# Patient Record
Sex: Female | Born: 1990 | Race: Black or African American | Hispanic: No | Marital: Single | Smoking: Current some day smoker
Health system: Southern US, Community
[De-identification: ages and names within clinical notes are randomized; demographics above are authoritative.]

## PROBLEM LIST (undated history)

## (undated) ENCOUNTER — Inpatient Hospital Stay (HOSPITAL_COMMUNITY): Payer: Self-pay

## (undated) DIAGNOSIS — A749 Chlamydial infection, unspecified: Secondary | ICD-10-CM

## (undated) DIAGNOSIS — N390 Urinary tract infection, site not specified: Secondary | ICD-10-CM

## (undated) DIAGNOSIS — B379 Candidiasis, unspecified: Secondary | ICD-10-CM

## (undated) DIAGNOSIS — D219 Benign neoplasm of connective and other soft tissue, unspecified: Secondary | ICD-10-CM

## (undated) DIAGNOSIS — Z789 Other specified health status: Secondary | ICD-10-CM

## (undated) HISTORY — PX: EYE SURGERY: SHX253

## (undated) HISTORY — PX: NO PAST SURGERIES: SHX2092

---
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
## 2008-02-01 ENCOUNTER — Emergency Department (HOSPITAL_COMMUNITY): Admission: EM | Admit: 2008-02-01 | Discharge: 2008-02-01 | Payer: Self-pay | Admitting: Emergency Medicine

## 2008-02-21 ENCOUNTER — Emergency Department (HOSPITAL_COMMUNITY): Admission: EM | Admit: 2008-02-21 | Discharge: 2008-02-21 | Payer: Self-pay | Admitting: Emergency Medicine

## 2008-08-03 ENCOUNTER — Emergency Department (HOSPITAL_COMMUNITY): Admission: EM | Admit: 2008-08-03 | Discharge: 2008-08-03 | Payer: Self-pay | Admitting: Emergency Medicine

## 2008-10-25 ENCOUNTER — Emergency Department (HOSPITAL_COMMUNITY): Admission: EM | Admit: 2008-10-25 | Discharge: 2008-10-25 | Payer: Self-pay | Admitting: Emergency Medicine

## 2009-02-05 ENCOUNTER — Emergency Department (HOSPITAL_COMMUNITY): Admission: EM | Admit: 2009-02-05 | Discharge: 2009-02-05 | Payer: Self-pay | Admitting: Family Medicine

## 2009-05-29 ENCOUNTER — Emergency Department (HOSPITAL_COMMUNITY): Admission: EM | Admit: 2009-05-29 | Discharge: 2009-05-29 | Payer: Self-pay | Admitting: Emergency Medicine

## 2009-12-31 ENCOUNTER — Emergency Department (HOSPITAL_COMMUNITY): Admission: EM | Admit: 2009-12-31 | Discharge: 2009-12-31 | Payer: Self-pay | Admitting: Emergency Medicine

## 2010-01-14 ENCOUNTER — Emergency Department (HOSPITAL_COMMUNITY): Admission: EM | Admit: 2010-01-14 | Discharge: 2010-01-15 | Payer: Self-pay | Admitting: Emergency Medicine

## 2010-08-06 ENCOUNTER — Emergency Department (HOSPITAL_COMMUNITY)
Admission: EM | Admit: 2010-08-06 | Discharge: 2010-08-06 | Disposition: A | Discharge status: Home / Self Care | Payer: Self-pay | Attending: Emergency Medicine | Admitting: Emergency Medicine

## 2010-08-06 DIAGNOSIS — H1130 Conjunctival hemorrhage, unspecified eye: Secondary | ICD-10-CM | POA: Insufficient documentation

## 2010-08-06 DIAGNOSIS — S01119A Laceration without foreign body of unspecified eyelid and periocular area, initial encounter: Secondary | ICD-10-CM | POA: Insufficient documentation

## 2010-08-06 DIAGNOSIS — T148XXA Other injury of unspecified body region, initial encounter: Secondary | ICD-10-CM | POA: Insufficient documentation

## 2010-08-06 LAB — URINALYSIS, ROUTINE W REFLEX MICROSCOPIC
Bilirubin Urine: NEGATIVE
Hgb urine dipstick: NEGATIVE
Ketones, ur: NEGATIVE mg/dL
Protein, ur: 100 mg/dL — AB
Urobilinogen, UA: 1 mg/dL (ref 0.0–1.0)

## 2010-08-06 LAB — URINE MICROSCOPIC-ADD ON
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
## 2010-08-28 LAB — URINALYSIS, ROUTINE W REFLEX MICROSCOPIC
Glucose, UA: NEGATIVE mg/dL
Hgb urine dipstick: NEGATIVE
Specific Gravity, Urine: 1.025 (ref 1.005–1.030)
Urobilinogen, UA: 1 mg/dL (ref 0.0–1.0)
pH: 7 (ref 5.0–8.0)

## 2010-08-28 LAB — URINE CULTURE: Culture: NO GROWTH

## 2010-08-28 LAB — WET PREP, GENITAL: Clue Cells Wet Prep HPF POC: NONE SEEN

## 2010-08-28 LAB — GC/CHLAMYDIA PROBE AMP, GENITAL

## 2010-08-28 LAB — CULTURE, ROUTINE-ABSCESS

## 2010-09-14 LAB — DIFFERENTIAL
Eosinophils Absolute: 0.3 10*3/uL (ref 0.0–0.7)
Eosinophils Relative: 3 % (ref 0–5)
Lymphs Abs: 3.1 10*3/uL (ref 0.7–4.0)
Monocytes Absolute: 0.7 10*3/uL (ref 0.1–1.0)
Monocytes Relative: 9 % (ref 3–12)

## 2010-09-14 LAB — POCT I-STAT, CHEM 8
HCT: 40 % (ref 36.0–46.0)
Hemoglobin: 13.6 g/dL (ref 12.0–15.0)
Potassium: 4 mEq/L (ref 3.5–5.1)
Sodium: 139 mEq/L (ref 135–145)

## 2010-09-14 LAB — CBC
HCT: 36.3 % (ref 36.0–46.0)
MCV: 80.8 fL (ref 78.0–100.0)
RBC: 4.49 MIL/uL (ref 3.87–5.11)
WBC: 7.8 10*3/uL (ref 4.0–10.5)

## 2010-09-14 LAB — URINE MICROSCOPIC-ADD ON

## 2010-09-14 LAB — URINALYSIS, ROUTINE W REFLEX MICROSCOPIC
Bilirubin Urine: NEGATIVE
Nitrite: NEGATIVE
Protein, ur: NEGATIVE mg/dL
Specific Gravity, Urine: 1.026 (ref 1.005–1.030)
Urobilinogen, UA: 1 mg/dL (ref 0.0–1.0)

## 2010-09-14 LAB — GC/CHLAMYDIA PROBE AMP, GENITAL

## 2010-09-19 LAB — CULTURE, ROUTINE-ABSCESS

## 2010-09-29 LAB — URINE MICROSCOPIC-ADD ON

## 2010-09-29 LAB — WET PREP, GENITAL: Yeast Wet Prep HPF POC: NONE SEEN

## 2010-09-29 LAB — URINALYSIS, ROUTINE W REFLEX MICROSCOPIC
Glucose, UA: NEGATIVE mg/dL
Ketones, ur: 15 mg/dL — AB
Nitrite: NEGATIVE
Specific Gravity, Urine: 1.037 — ABNORMAL HIGH (ref 1.005–1.030)
pH: 6 (ref 5.0–8.0)

## 2010-10-14 ENCOUNTER — Emergency Department (HOSPITAL_COMMUNITY)
Admission: EM | Admit: 2010-10-14 | Discharge: 2010-10-14 | Disposition: A | Discharge status: Home / Self Care | Payer: Self-pay | Attending: Emergency Medicine | Admitting: Emergency Medicine

## 2010-10-14 DIAGNOSIS — N390 Urinary tract infection, site not specified: Secondary | ICD-10-CM | POA: Insufficient documentation

## 2010-10-14 DIAGNOSIS — A499 Bacterial infection, unspecified: Secondary | ICD-10-CM | POA: Insufficient documentation

## 2010-10-14 DIAGNOSIS — B9689 Other specified bacterial agents as the cause of diseases classified elsewhere: Secondary | ICD-10-CM | POA: Insufficient documentation

## 2010-10-14 DIAGNOSIS — N760 Acute vaginitis: Secondary | ICD-10-CM | POA: Insufficient documentation

## 2010-10-14 LAB — WET PREP, GENITAL
Trich, Wet Prep: NONE SEEN
Yeast Wet Prep HPF POC: NONE SEEN

## 2010-10-14 LAB — URINALYSIS, ROUTINE W REFLEX MICROSCOPIC
Bilirubin Urine: NEGATIVE
Glucose, UA: NEGATIVE mg/dL
Hgb urine dipstick: NEGATIVE
Ketones, ur: NEGATIVE mg/dL
Protein, ur: NEGATIVE mg/dL
Urobilinogen, UA: 1 mg/dL (ref 0.0–1.0)

## 2010-10-27 NOTE — Op Note (Signed)
NAME:  Kayla Baird, Kayla Baird               ACCOUNT NO.:  [id number]   MEDICAL RECORD NO.:  [id number]          PATIENT TYPE:  INP   LOCATION:  9101                          FACILITY:  [facility]   PHYSICIAN:  Phil D. Okey Dupre, M.D.     DATE OF BIRTH:  10-31-1990   DATE OF PROCEDURE:  08/15/2007  DATE OF DISCHARGE:                               OPERATIVE REPORT   PREOPERATIVE DIAGNOSIS:  1. Intrauterine pregnancy at 40 weeks, 5 days gestation.  2. Fetal tachycardia with variable decelerations.  3. Meconium-stained amniotomy fluid.  4. Group B Streptococcus positive.  5. Intrapartum fever.  6. No prenatal care.   POSTOPERATIVE DIAGNOSIS:  1. Intrauterine pregnancy at 40 weeks, 5 days gestation.  2. Fetal tachycardia with variable decelerations.  3. Meconium-stained amniotomy fluid.  4. Group B Streptococcus positive.  5. Intrapartum fever.  6. No prenatal care.   PROCEDURE:  Vacuum-assisted vaginal delivery with Kiwi vacuum.   SURGEON:  Karlton Lemon, MD   ATTENDING:  Javier Glazier. Okey Dupre, M.D.   ANESTHESIA:  Epidural.   FINDINGS:  A viable infant female weighing 6 pounds, 7 ounces with Apgars  5 at 1 minute, 8 at 5 minutes with core pH of 7.14.   SPECIMENS:  None.   ESTIMATED BLOOD LOSS:  500 ml.   COMPLICATIONS:  None immediate.   INDICATIONS FOR PROCEDURE:  Kayla Baird is a 20 year old gravida 1, para  0, at 40 weeks, 5 days that presented with rupture of membranes.  Her  prenatal course was complicated by no prenatal care.  During her labor,  she was found to have meconium-stained amniotic fluid.  She developed  fever approximately one to two hours before delivery requiring  ampicillin and gentamicin, and the baby started having fetal tachycardia  40 minutes prior to delivery after onset of maternal fever.   INDICATION FOR PROCEDURE:  Fetal tachycardia.   DESCRIPTION OF PROCEDURE:  Kayla Baird was in the lithotomy position and  pushing.  The infant's head was +3 station and with pushing there  was  minimal descent of the head past +3 station.  Kiwi vacuum was applied to  the fetal scalp being careful not to place it over anterior fontanelle.  The placement of the vacuum was confirmed, and no vaginal tissue was  felt to be within the vacuum.  With the patient's next contraction, the  Kiwi vacuum was pumped up into the green range, and while the patient  was pushing, traction was applied to the Kiwi vacuum.  With one long  steady pull, the infant's head was delivered in occiput posterior  position.  Vacuum was removed from the infant's head.  There was a  nuchal cord x1 noted and was reduced on the perineum.  The infant was  then delivered atraumatically.  Infant was bulb suctioned after delivery  and gave a weak cry.  Cord was clamped x2 and incised between the  clamps.  The infant was handed to the neonatology team in attendance.  Cord blood and pH were then collected.  Placenta was delivered  spontaneously intact with three-vessel cord and heavy  meconium staining  of the membranes.  Vagina and perineum were inspected with a second-  degree perineal laceration noted.  This was repaired in the usual  manner.  Figure-of-eight sutures were needed after initial repair to  provide good hemostasis.  Estimated blood loss for the procedure was 500  ml.  The patient did receive Methergine 0.2 mg for brisk bleeding as  well as bimanual uterine massage.  The patient tolerated the procedure  well and will go to mother-baby unit in stable condition.      Karlton Lemon, MD  Electronically Signed     ______________________________  Javier Glazier. Okey Dupre, M.D.    NS/MEDQ  D:  08/16/2007  T:  08/16/2007  Job:  [id number]

## 2011-02-08 ENCOUNTER — Emergency Department (HOSPITAL_COMMUNITY)
Admission: EM | Admit: 2011-02-08 | Discharge: 2011-02-08 | Disposition: A | Discharge status: Home / Self Care | Payer: Medicaid Other | Attending: Emergency Medicine | Admitting: Emergency Medicine

## 2011-02-08 ENCOUNTER — Inpatient Hospital Stay (INDEPENDENT_AMBULATORY_CARE_PROVIDER_SITE_OTHER)
Admission: RE | Admit: 2011-02-08 | Discharge: 2011-02-08 | Disposition: A | Discharge status: Home / Self Care | Payer: Self-pay | Source: Ambulatory Visit | Attending: Family Medicine | Admitting: Family Medicine

## 2011-02-08 ENCOUNTER — Emergency Department (HOSPITAL_COMMUNITY): Payer: Medicaid Other

## 2011-02-08 DIAGNOSIS — M25519 Pain in unspecified shoulder: Secondary | ICD-10-CM | POA: Insufficient documentation

## 2011-02-08 DIAGNOSIS — M545 Low back pain, unspecified: Secondary | ICD-10-CM | POA: Insufficient documentation

## 2011-02-08 DIAGNOSIS — S0990XA Unspecified injury of head, initial encounter: Secondary | ICD-10-CM

## 2011-02-08 DIAGNOSIS — R109 Unspecified abdominal pain: Secondary | ICD-10-CM | POA: Insufficient documentation

## 2011-02-08 DIAGNOSIS — Z733 Stress, not elsewhere classified: Secondary | ICD-10-CM

## 2011-02-08 DIAGNOSIS — M542 Cervicalgia: Secondary | ICD-10-CM

## 2011-02-08 DIAGNOSIS — R51 Headache: Secondary | ICD-10-CM | POA: Insufficient documentation

## 2011-02-08 DIAGNOSIS — S139XXA Sprain of joints and ligaments of unspecified parts of neck, initial encounter: Secondary | ICD-10-CM | POA: Insufficient documentation

## 2011-02-08 DIAGNOSIS — Y9229 Other specified public building as the place of occurrence of the external cause: Secondary | ICD-10-CM | POA: Insufficient documentation

## 2011-02-08 DIAGNOSIS — H5789 Other specified disorders of eye and adnexa: Secondary | ICD-10-CM | POA: Insufficient documentation

## 2011-02-08 LAB — POCT PREGNANCY, URINE: Preg Test, Ur: NEGATIVE

## 2011-02-08 LAB — URINALYSIS, ROUTINE W REFLEX MICROSCOPIC
Bilirubin Urine: NEGATIVE
Glucose, UA: NEGATIVE mg/dL
Ketones, ur: 15 mg/dL — AB
Leukocytes, UA: NEGATIVE
Nitrite: NEGATIVE
Protein, ur: NEGATIVE mg/dL

## 2011-03-05 LAB — WET PREP, GENITAL
Clue Cells Wet Prep HPF POC: NONE SEEN
Trich, Wet Prep: NONE SEEN
Yeast Wet Prep HPF POC: NONE SEEN

## 2011-03-05 LAB — DIFFERENTIAL
Basophils Absolute: 0
Eosinophils Relative: 3
Lymphocytes Relative: 30
Lymphs Abs: 1.6
Neutro Abs: 3.3
Neutrophils Relative %: 60

## 2011-03-05 LAB — TYPE AND SCREEN
ABO/RH(D): O POS
Antibody Screen: NEGATIVE

## 2011-03-05 LAB — URINALYSIS, ROUTINE W REFLEX MICROSCOPIC
Bilirubin Urine: NEGATIVE
Ketones, ur: NEGATIVE
Nitrite: NEGATIVE
Specific Gravity, Urine: 1.015
pH: 6.5

## 2011-03-05 LAB — RUBELLA SCREEN: Rubella: 40.3 — ABNORMAL HIGH

## 2011-03-05 LAB — URINE CULTURE: Colony Count: >=100000

## 2011-03-05 LAB — GC/CHLAMYDIA PROBE AMP, GENITAL
Chlamydia, DNA Probe: NEGATIVE
Chlamydia, DNA Probe: NEGATIVE
GC Probe Amp, Genital: NEGATIVE
GC Probe Amp, Genital: NEGATIVE

## 2011-03-05 LAB — RAPID URINE DRUG SCREEN, HOSP PERFORMED
Barbiturates: NOT DETECTED
Benzodiazepines: NOT DETECTED
Cocaine: NOT DETECTED
Opiates: NOT DETECTED

## 2011-03-05 LAB — CBC
HCT: 31.2 — ABNORMAL LOW
Platelets: 255
RDW: 14.2
WBC: 5.4

## 2011-03-05 LAB — URINE MICROSCOPIC-ADD ON

## 2011-03-05 LAB — HEPATITIS B SURFACE ANTIGEN: Hepatitis B Surface Ag: NEGATIVE

## 2011-03-05 LAB — STREP B DNA PROBE

## 2011-03-05 LAB — HIV ANTIBODY (ROUTINE TESTING W REFLEX): HIV: NONREACTIVE

## 2011-03-08 LAB — CBC
HCT: 28.6 — ABNORMAL LOW
Hemoglobin: 9.7 — ABNORMAL LOW
MCHC: 33.8
RDW: 14.6

## 2011-03-08 LAB — COMPREHENSIVE METABOLIC PANEL
Alkaline Phosphatase: 165 — ABNORMAL HIGH
BUN: 4 — ABNORMAL LOW
Calcium: 8.7
Glucose, Bld: 83
Potassium: 3.9
Total Protein: 5.5 — ABNORMAL LOW

## 2011-03-08 LAB — URINALYSIS, ROUTINE W REFLEX MICROSCOPIC
Nitrite: NEGATIVE
Specific Gravity, Urine: 1.01
Urobilinogen, UA: 0.2

## 2011-03-08 LAB — RPR: RPR Ser Ql: NONREACTIVE

## 2011-03-08 LAB — URIC ACID: Uric Acid, Serum: 3.4

## 2011-03-08 LAB — RAPID URINE DRUG SCREEN, HOSP PERFORMED
Barbiturates: NOT DETECTED
Opiates: NOT DETECTED

## 2011-03-17 LAB — URINALYSIS, ROUTINE W REFLEX MICROSCOPIC
Hgb urine dipstick: NEGATIVE
Nitrite: NEGATIVE
Specific Gravity, Urine: 1.016
Urobilinogen, UA: 1

## 2011-03-17 LAB — GC/CHLAMYDIA PROBE AMP, GENITAL: Chlamydia, DNA Probe: NEGATIVE

## 2011-03-17 LAB — URINE MICROSCOPIC-ADD ON

## 2011-03-17 LAB — WET PREP, GENITAL: Trich, Wet Prep: NONE SEEN

## 2011-03-17 LAB — PREGNANCY, URINE: Preg Test, Ur: NEGATIVE

## 2011-03-29 LAB — URINE MICROSCOPIC-ADD ON

## 2011-03-29 LAB — URINALYSIS, ROUTINE W REFLEX MICROSCOPIC
Bilirubin Urine: NEGATIVE
Glucose, UA: NEGATIVE
Hgb urine dipstick: NEGATIVE
pH: 6.5

## 2011-03-29 LAB — POCT PREGNANCY, URINE
Operator id: 29026
Preg Test, Ur: POSITIVE

## 2011-03-29 LAB — GC/CHLAMYDIA PROBE AMP, GENITAL
Chlamydia, DNA Probe: NEGATIVE
GC Probe Amp, Genital: POSITIVE — AB

## 2011-06-19 ENCOUNTER — Emergency Department (HOSPITAL_COMMUNITY)
Admission: EM | Admit: 2011-06-19 | Discharge: 2011-06-19 | Disposition: A | Discharge status: Home / Self Care | Payer: Medicaid Other | Attending: Emergency Medicine | Admitting: Emergency Medicine

## 2011-06-19 ENCOUNTER — Encounter: Payer: Self-pay | Admitting: Emergency Medicine

## 2011-06-19 DIAGNOSIS — Z043 Encounter for examination and observation following other accident: Secondary | ICD-10-CM | POA: Insufficient documentation

## 2011-06-19 DIAGNOSIS — F17200 Nicotine dependence, unspecified, uncomplicated: Secondary | ICD-10-CM | POA: Insufficient documentation
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
## 2011-10-14 ENCOUNTER — Encounter (HOSPITAL_COMMUNITY): Payer: Self-pay | Admitting: *Deleted

## 2011-10-14 ENCOUNTER — Emergency Department (HOSPITAL_COMMUNITY)
Admission: EM | Admit: 2011-10-14 | Discharge: 2011-10-14 | Disposition: A | Discharge status: Home / Self Care | Payer: Medicaid Other | Attending: Emergency Medicine | Admitting: Emergency Medicine

## 2011-10-14 DIAGNOSIS — M79609 Pain in unspecified limb: Secondary | ICD-10-CM | POA: Insufficient documentation

## 2011-10-14 DIAGNOSIS — F17200 Nicotine dependence, unspecified, uncomplicated: Secondary | ICD-10-CM | POA: Insufficient documentation

## 2011-10-14 DIAGNOSIS — IMO0002 Reserved for concepts with insufficient information to code with codable children: Secondary | ICD-10-CM | POA: Insufficient documentation

## 2011-10-14 DIAGNOSIS — L02411 Cutaneous abscess of right axilla: Secondary | ICD-10-CM

## 2011-10-14 MED ORDER — LIDOCAINE-EPINEPHRINE (PF) 2 %-1:200000 IJ SOLN
10.0000 mL | Freq: Once | INTRAMUSCULAR | Status: AC
  Administered 2011-10-14: 10 mL

## 2011-10-14 MED ORDER — HYDROCODONE-ACETAMINOPHEN 5-325 MG PO TABS
1.0000 | ORAL_TABLET | Freq: Once | ORAL | Status: AC
  Administered 2011-10-14: 1 via ORAL
  Filled 2011-10-14: qty 1

## 2011-10-14 NOTE — ED Notes (Signed)
Pt reports she has an abscess to right underarm. Pt noted area on Monday. Pt reports history of abscesses to this area multiple times before.  Pt denies fever.  Pt denies taking medication for pain.

## 2011-10-14 NOTE — ED Provider Notes (Signed)
History     CSN: [id number]  Arrival date & time 10/14/11  1478   First MD Initiated Contact with Patient 10/14/11 0840      Chief Complaint  Patient presents with  . Abscess    (Consider location/radiation/quality/duration/timing/severity/associated sxs/prior treatment) HPI History from patient. 21 year old female who presents with abscess to the right underarm. 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 She was referred to surgery for this, and they recommended she undergo surgical excision, but she did not follow through with this. She states that this came up on Sunday and has grown larger in size since. There's been no drainage from the area. She denies fever or chills. She has noted no overlying redness to the area.  History reviewed. No pertinent past medical history.  History reviewed. No pertinent past surgical history.  No family history on file.  History  Substance Use Topics  . Smoking status: Current Everyday Smoker  . Smokeless tobacco: Not on file  . Alcohol Use: Yes    OB History    Grav Para Term Preterm Abortions TAB SAB Ect Mult Living                  Review of Systems  Constitutional: Negative for fever and chills.  Musculoskeletal: Negative.   Skin: Positive for wound.  Neurological: Negative.     Allergies  Review of patient's allergies indicates no known allergies.  Home Medications  No current outpatient prescriptions on file.  BP 144/72  Pulse 77  Temp(Src) 97.6 F (36.4 C) (Oral)  Resp 17  SpO2 99%  LMP 09/14/2011  Physical Exam  Nursing note and vitals reviewed. Constitutional: She appears well-developed and well-nourished. No distress.  HENT:  Head: Normocephalic and atraumatic.  Neck: Normal range of motion.  Cardiovascular: Normal rate.   Pulmonary/Chest: Effort normal.  Musculoskeletal: Normal range of motion.  Neurological: She is alert.    Skin: Skin is warm and dry. She is not diaphoretic.       Approximately a 3 cm area of fluctuance to the right axilla. No drainage noted. No overlying cellulitis. Area is tender to palpation. Multiple scars noted from previous incisions and drainage.  Psychiatric: She has a normal mood and affect.    ED Course  Procedures (including critical care time)  INCISION AND DRAINAGE Performed by: Grant Fontana Consent: Verbal consent obtained. Risks and benefits: risks, benefits and alternatives were discussed Type: abscess  Body area: R axilla  Anesthesia: local infiltration  Local anesthetic: lidocaine 2% with epinephrine  Anesthetic total: 1.5 ml  Complexity: complex Blunt dissection to break up loculations  Drainage: purulent  Drainage amount: moderate  Packing material: 1/4 in iodoform gauze  Patient tolerance: Patient tolerated the procedure well with no immediate complications.     Labs Reviewed - No data to display No results found.   1. Abscess of right axilla       MDM  21 year old female with history of recurrent abscess who presents with an abscess to her axilla. No fever or chills. Area was incised and drained, which was well tolerated. The area was packed. She was advised to followup in 2 days for a wound recheck. Return precautions discussed. She verbalized understanding and was agreeable to plan.        Bogue Chitto, [place] 10/14/11 [phone]

## 2011-10-14 NOTE — Discharge Instructions (Signed)
We drained the abscess today. Please keep the area clean and dry for the next 24 hours. You may shower as normal provided you do not scrub the area. Return to the emergency department or to urgent care in 2 days for a wound recheck and to have the packing pulled out. Consider making a followup appointment with [facility] to talk about surgical procedures. If you develop redness around the area, fever or chills, or any other worrisome symptoms, please return to the emergency department.  RESOURCE GUIDE  Dental Problems  Patients with Medicaid: [facility] [phone]                                           [phone] [address] Phone:  [phone]                                                  Phone:  [phone]  If unable to pay or uninsured, contact:  [facility] or [facility]. to become qualified for the adult dental clinic.  Chronic Pain Problems Contact [facility]  [phone] Patients need to be referred by their primary care doctor.  Insufficient Money for Medicine Contact [employer]:  call "211" or [facility] 815-717-5613.  No Primary Care Doctor Call [facility]  5617760706 Other agencies that provide inexpensive medical care    [facility]  [phone]    [facility]  [phone]    [facility]  [phone]    [facility]  [phone]    [facility]  [phone]    [facility]  [phone]  Psychological Services [facility] Behavioral Health  [phone] [facility] Services  [phone] [facility] Mental Health   [phone] (emergency services [phone])  Substance Abuse Resources [facility]  [phone] [facility] [phone] [facility] [phone] Floydene Flock [phone] Residential & Outpatient Substance Abuse Program  [phone]  Abuse/Neglect [facility] Child  Abuse Hotline 4311840090 [facility] Child Abuse Hotline [phone] (After Hours)  Emergency Shelter [facility] [phone]  Maternity Homes [facility] at [facility] [place] [facility] [phone] [facility] 5071312862  MRSA Hotline #:   430-630-9329    [facility] Resources  [facility] [place]     [employer]                          [facility] Dept. [address] [place]                       [address]      [address]  Patrecia Pace  Michell Heinrich Phone:  [phone]                                   Phone:  [phone]                 Phone:  [phone]  [facility] Phone:  [phone]  [facility] Child Abuse Hotline (206) 622-7130 [phone]) [phone] (After Hours)   Abscess An abscess (boil or furuncle) is an infected area that contains a collection of pus.  SYMPTOMS Signs and symptoms of an abscess include pain, tenderness, redness, or hardness. You may feel a moveable soft area under your skin. An abscess can occur anywhere in the body.  TREATMENT  A surgical cut (incision) may be made over your abscess to drain the pus. Gauze may be packed into the space or a drain may be looped through the abscess cavity (pocket). This provides a drain that will allow the cavity to heal from the inside outwards. The abscess may be painful for a few days, but should feel much better if it was drained.  Your abscess, if seen early, may not have localized and may not have been drained. If not, another appointment may be required if it does not get better on its own or with medications. HOME CARE INSTRUCTIONS   Only take over-the-counter or prescription medicines for pain, discomfort, or fever as directed by your caregiver.   Take your antibiotics as directed if they were prescribed. Finish them even if you start to  feel better.   Keep the skin and clothes clean around your abscess.   If the abscess was drained, you will need to use gauze dressing to collect any draining pus. Dressings will typically need to be changed 3 or more times a day.   The infection may spread by skin contact with others. Avoid skin contact as much as possible.   Practice good hygiene. This includes regular hand washing, cover any draining skin lesions, and do not share personal care items.   If you participate in sports, do not share athletic equipment, towels, whirlpools, or personal care items. Shower after every practice or tournament.   If a draining area cannot be adequately covered:   Do not participate in sports.   Children should not participate in day care until the wound has healed or drainage stops.   If your caregiver has given you a follow-up appointment, it is very important to keep that appointment. Not keeping the appointment could result in a much worse infection, chronic or permanent injury, pain, and disability. If there is any problem keeping the appointment, you must call back to this facility for assistance.  SEEK MEDICAL CARE IF:   You develop increased pain, swelling, redness, drainage, or bleeding in the wound site.   You develop signs of generalized infection including muscle aches, chills, fever, or a general ill feeling.   You have an oral temperature above 102 F (38.9 C).  MAKE SURE YOU:   Understand these instructions.   Will watch your condition.   Will get help right away if you are not doing well or get worse.  Document Released: 03/10/2005 Document Revised: 05/20/2011 Document Reviewed: 01/02/2008 [facility] Patient Information 2012 [place], [place].

## 2011-10-14 NOTE — ED Provider Notes (Signed)
Medical screening examination/treatment/procedure(s) were performed by non-physician practitioner and as supervising physician I was immediately available for consultation/collaboration.  Cheri Guppy, MD 10/14/11 1537

## 2012-02-09 ENCOUNTER — Encounter (HOSPITAL_COMMUNITY): Payer: Self-pay | Admitting: *Deleted

## 2012-02-09 ENCOUNTER — Inpatient Hospital Stay (HOSPITAL_COMMUNITY)
Admission: AD | Admit: 2012-02-09 | Discharge: 2012-02-09 | Disposition: A | Discharge status: Home / Self Care | Payer: Medicaid Other | Source: Ambulatory Visit | Attending: Obstetrics and Gynecology | Admitting: Obstetrics and Gynecology

## 2012-02-09 DIAGNOSIS — B9689 Other specified bacterial agents as the cause of diseases classified elsewhere: Secondary | ICD-10-CM | POA: Insufficient documentation

## 2012-02-09 DIAGNOSIS — N760 Acute vaginitis: Secondary | ICD-10-CM | POA: Insufficient documentation

## 2012-02-09 DIAGNOSIS — Z3490 Encounter for supervision of normal pregnancy, unspecified, unspecified trimester: Secondary | ICD-10-CM

## 2012-02-09 DIAGNOSIS — N949 Unspecified condition associated with female genital organs and menstrual cycle: Secondary | ICD-10-CM | POA: Insufficient documentation

## 2012-02-09 DIAGNOSIS — A499 Bacterial infection, unspecified: Secondary | ICD-10-CM | POA: Insufficient documentation

## 2012-02-09 DIAGNOSIS — O2390 Unspecified genitourinary tract infection in pregnancy, unspecified trimester: Secondary | ICD-10-CM | POA: Insufficient documentation

## 2012-02-09 HISTORY — DX: Candidiasis, unspecified: B37.9

## 2012-02-09 LAB — WET PREP, GENITAL
Trich, Wet Prep: NONE SEEN
Yeast Wet Prep HPF POC: NONE SEEN

## 2012-02-09 LAB — URINALYSIS, ROUTINE W REFLEX MICROSCOPIC
Glucose, UA: NEGATIVE mg/dL
Hgb urine dipstick: NEGATIVE
Leukocytes, UA: NEGATIVE
Specific Gravity, Urine: >1.03 — ABNORMAL HIGH (ref 1.005–1.030)
Urobilinogen, UA: 1 mg/dL (ref 0.0–1.0)

## 2012-02-09 MED ORDER — CONCEPT OB 130-92.4-1 MG PO CAPS: 1.0000 | ORAL_CAPSULE | Freq: Every day | ORAL | Status: DC

## 2012-02-09 MED ORDER — METRONIDAZOLE 500 MG PO TABS: 500.0000 mg | ORAL_TABLET | Freq: Two times a day (BID) | ORAL | Status: AC

## 2012-02-09 NOTE — MAU Provider Note (Signed)
Chief Complaint: Possible Pregnancy, Vaginal Discharge and Abdominal Pain   First Provider Initiated Contact with Patient 02/09/12 2102     SUBJECTIVE HPI: Kayla Baird is a 21 y.o. G1P1001 who presents with amenorrhea since either 11/21/11 or 12/21/11 and malodorous vaginal discharge. Has not taken UPT. Denie fever, chills, abd pain (reported to RN, but denied to CNM) vaginal bleeding, urinary Sx or GI Sx.   Past Medical History  Diagnosis Date  . Yeast infection    OB History    Grav Para Term Preterm Abortions TAB SAB Ect Mult Living   2 1 1       1      # Outc Date GA Lbr Len/2nd Wgt Sex Del Anes PTL Lv   1 TRM            2 CUR              Past Surgical History  Procedure Date  . No past surgeries    History   Social History  . Marital Status: Single    Spouse Name: N/A    Number of Children: N/A  . Years of Education: N/A   Occupational History  . Not on file.   Social History Main Topics  . Smoking status: Current Everyday Smoker -- 0.2 packs/day  . Smokeless tobacco: Not on file  . Alcohol Use: Yes  . Drug Use: No  . Sexually Active:    Other Topics Concern  . Not on file   Social History Narrative  . No narrative on file   No current facility-administered medications on file prior to encounter.   No current outpatient prescriptions on file prior to encounter.   No Known Allergies  ROS: Pertinent items in HPI  OBJECTIVE Blood pressure 108/65, pulse 87, temperature 98 F (36.7 C), temperature source Oral, resp. rate 16, height 5\' 6"  (1.676 m), weight 85.367 kg (188 lb 3.2 oz), last menstrual period 11/21/2011. GENERAL: Well-developed, well-nourished female in no acute distress.  HEENT: Normocephalic HEART: normal rate RESP: normal effort ABDOMEN: Soft, non-tender EXTREMITIES: Nontender, no edema NEURO: Alert and oriented SPECULUM EXAM: NEFG, small amount of malodorous discharge, no blood noted, cervix clean BIMANUAL: cervix closed; uterus 12  week size, no adnexal tenderness or masses  LAB RESULTS Results for orders placed during the hospital encounter of 02/09/12 (from the past 24 hour(s))  URINALYSIS, ROUTINE W REFLEX MICROSCOPIC     Status: Abnormal   Collection Time   02/09/12  7:36 PM      Component Value Range   Color, Urine YELLOW  YELLOW   APPearance CLEAR  CLEAR   Specific Gravity, Urine >1.030 (*) 1.005 - 1.030   pH 6.0  5.0 - 8.0   Glucose, UA NEGATIVE  NEGATIVE mg/dL   Hgb urine dipstick NEGATIVE  NEGATIVE   Bilirubin Urine NEGATIVE  NEGATIVE   Ketones, ur NEGATIVE  NEGATIVE mg/dL   Protein, ur NEGATIVE  NEGATIVE mg/dL   Urobilinogen, UA 1.0  0.0 - 1.0 mg/dL   Nitrite NEGATIVE  NEGATIVE   Leukocytes, UA NEGATIVE  NEGATIVE  POCT PREGNANCY, URINE     Status: Abnormal   Collection Time   02/09/12  7:46 PM      Component Value Range   Preg Test, Ur POSITIVE (*) NEGATIVE  WET PREP, GENITAL     Status: Abnormal   Collection Time   02/09/12  8:48 PM      Component Value Range   Yeast Wet Prep HPF  POC NONE SEEN  NONE SEEN   Trich, Wet Prep NONE SEEN  NONE SEEN   Clue Cells Wet Prep HPF POC MODERATE (*) NONE SEEN   WBC, Wet Prep HPF POC FEW (*) NONE SEEN    IMAGING CRL 11.6 weeks by informal BS [place]. FHR 150. Pos FM.   ASSESSMENT 1. Pregnancy with uncertain dates   2. BV (bacterial vaginosis)     PLAN Discharge home Pregnancy verification letter and list of providers given.  Medication List  As of 02/09/2012  9:35 PM   TAKE these medications         CONCEPT OB 130-92.4-1 MG Caps   Take 1 tablet by mouth daily.      ibuprofen 200 MG tablet   Commonly known as: ADVIL,MOTRIN   Take 200 mg by mouth every 6 (six) hours as needed. headache      metroNIDAZOLE 500 MG tablet   Commonly known as: FLAGYL   Take 1 tablet (500 mg total) by mouth 2 (two) times daily.           Follow-up Information    Please follow up. (start prenatal care)          Dorathy Kinsman, CNM 02/09/2012  9:35 PM

## 2012-02-09 NOTE — MAU Note (Signed)
Pt G1 P1, LMP 12/21/2011, having abd pain and vag discharge with an odor.

## 2012-02-09 NOTE — Discharge Instructions (Signed)
Prenatal Care [facility]    [facility]  [phone]     Phone: [phone]          [facility]                      [facility]  @[place]     Phone: [phone]  Phone: [phone]         [facility]     Phone: [phone]  Phone: [phone]           [facility] [place]                hone: [phone]  Phone: [phone]         [facility] Francoise Ceo      Phone: [phone]  Phone: [phone]         [facility]                Phone: [phone]  [facility]   [address] [place])          Phone: [phone] [facility]   Phone: [phone]  ABCs of Pregnancy A Antepartum care is very important. Be sure you see your doctor and get prenatal care as soon as you think you are pregnant. At this time, you will be tested for infection, genetic abnormalities and potential problems with you and the pregnancy. This is the time to discuss diet, exercise, work, medications, labor, pain medication during labor and the possibility of a cesarean delivery. Ask any questions that may concern you. It is important to see your doctor regularly throughout your pregnancy. Avoid exposure to toxic substances and chemicals - such as cleaning solvents, lead and mercury, some insecticides, and paint. Pregnant women should avoid exposure to paint fumes, and fumes that cause you to feel ill, dizzy or faint. When possible, it is a good idea to have a pre-pregnancy consultation with your caregiver to begin some important recommendations your caregiver suggests such as, taking folic acid, exercising, quitting smoking, avoiding alcoholic beverages, etc. B Breastfeeding is the healthiest choice for both you and your baby. It has many nutritional benefits  for the baby and health benefits for the mother. It also creates a very tight and loving bond between the baby and mother. Talk to your doctor, your family and friends, and your employer about how you choose to feed your baby and how they can support you in your decision. Not all birth defects can be prevented, but a woman can take actions that may increase her chance of having a healthy baby. Many birth defects happen very early in pregnancy, sometimes before a woman even knows she is pregnant. Birth defects or abnormalities of any child in your or the father's family should be discussed with your caregiver. Get a good support bra as your breast size changes. Wear it especially when you exercise and when nursing.  C Celebrate the news of your pregnancy with the your spouse/father and family. Childbirth classes are helpful to take for you and the spouse/father because it helps to understand what happens during the pregnancy, labor and delivery. Cesarean delivery should be discussed with your doctor so you are prepared for that possibility. The pros and cons of circumcision if it is a boy, should be discussed  with your pediatrician. Cigarette smoking during pregnancy can result in low birth weight babies. It has been associated with infertility, miscarriages, tubal pregnancies, infant death (mortality) and poor health (morbidity) in childhood. Additionally, cigarette smoking may cause long-term learning disabilities. If you smoke, you should try to quit before getting pregnant and not smoke during the pregnancy. Secondary smoke may also harm a mother and her developing baby. It is a good idea to ask people to stop smoking around you during your pregnancy and after the baby is born. Extra calcium is necessary when you are pregnant and is found in your prenatal vitamin, in dairy products, green leafy vegetables and in calcium supplements. D A healthy diet according to your current weight and height, along with  vitamins and mineral supplements should be discussed with your caregiver. Domestic abuse or violence should be made known to your doctor right away to get the situation corrected. Drink more water when you exercise to keep hydrated. Discomfort of your back and legs usually develops and progresses from the middle of the second trimester through to delivery of the baby. This is because of the enlarging baby and uterus, which may also affect your balance. Do not take illegal drugs. Illegal drugs can seriously harm the baby and you. Drink extra fluids (water is best) throughout pregnancy to help your body keep up with the increases in your blood volume. Drink at least 6 to 8 glasses of water, fruit juice, or milk each day. A good way to know you are drinking enough fluid is when your urine looks almost like clear water or is very light yellow.  E Eat healthy to get the nutrients you and your unborn baby need. Your meals should include the five basic food groups. Exercise (30 minutes of light to moderate exercise a day) is important and encouraged during pregnancy, if there are no medical problems or problems with the pregnancy. Exercise that causes discomfort or dizziness should be stopped and reported to your caregiver. Emotions during pregnancy can change from being ecstatic to depression and should be understood by you, your partner and your family. F Fetal screening with ultrasound, amniocentesis and monitoring during pregnancy and labor is common and sometimes necessary. Take 400 micrograms of folic acid daily both before, when possible, and during the first few months of pregnancy to reduce the risk of birth defects of the brain and spine. All women who could possibly become pregnant should take a vitamin with folic acid, every day. It is also important to eat a healthy diet with fortified foods (enriched grain products, including cereals, rice, breads, and pastas) and foods with natural sources of folate  (orange juice, green leafy vegetables, beans, peanuts, broccoli, asparagus, peas, and lentils). The father should be involved with all aspects of the pregnancy including, the prenatal care, childbirth classes, labor, delivery, and postpartum time. Fathers may also have emotional concerns about being a father, financial needs, and raising a family. G Genetic testing should be done appropriately. It is important to know your family and the father's history. If there have been problems with pregnancies or birth defects in your family, report these to your doctor. Also, genetic counselors can talk with you about the information you might need in making decisions about having a family. You can call a major medical center in your area for help in finding a board-certified genetic counselor. Genetic testing and counseling should be done before pregnancy when possible, especially if there is a history of problems in  the mother's or father's family. Certain ethnic backgrounds are more at risk for genetic defects. H Get familiar with the hospital where you will be having your baby. Get to know how long it takes to get there, the labor and delivery area, and the hospital procedures. Be sure your medical insurance is accepted there. Get your home ready for the baby including, clothes, the baby's room (when possible), furniture and car seat. Hand washing is important throughout the day, especially after handling raw meat and poultry, changing the baby's diaper or using the bathroom. This can help prevent the spread of many bacteria and viruses that cause infection. Your hair may become dry and thinner, but will return to normal a few weeks after the baby is born. Heartburn is a common problem that can be treated by taking antacids recommended by your caregiver, eating smaller meals 5 or 6 times a day, not drinking liquids when eating, drinking between meals and raising the head of your bed 2 to 3 inches. I Insurance to  cover you, the baby, doctor and hospital should be reviewed so that you will be prepared to pay any costs not covered by your insurance plan. If you do not have medical insurance, there are usually clinics and services available for you in your community. Take 30 milligrams of iron during your pregnancy as prescribed by your doctor to reduce the risk of low red blood cells (anemia) later in pregnancy. All women of childbearing age should eat a diet rich in iron. J There should be a joint effort for the mother, father and any other children to adapt to the pregnancy financially, emotionally, and psychologically during the pregnancy. Join a support group for moms-to-be. Or, join a class on parenting or childbirth. Have the family participate when possible. K Know your limits. Let your caregiver know if you experience any of the following:   Pain of any kind.   Strong cramps.   You develop a lot of weight in a short period of time (5 pounds in 3 to 5 days).   Vaginal bleeding, leaking of amniotic fluid.   Headache, vision problems.   Dizziness, fainting, shortness of breath.   Chest pain.   Fever of 102 F (38.9 C) or higher.   Gush of clear fluid from your vagina.   Painful urination.   Domestic violence.   Irregular heartbeat (palpitations).   Rapid beating of the heart (tachycardia).   Constant feeling sick to your stomach (nauseous) and vomiting.   Trouble walking, fluid retention (edema).   Muscle weakness.   If your baby has decreased activity.   Persistent diarrhea.   Abnormal vaginal discharge.   Uterine contractions at 20-minute intervals.   Back pain that travels down your leg.  L Learn and practice that what you eat and drink should be in moderation and healthy for you and your baby. Legal drugs such as alcohol and caffeine are important issues for pregnant women. There is no safe amount of alcohol a woman can drink while pregnant. Fetal alcohol syndrome, a  disorder characterized by growth retardation, facial abnormalities, and central nervous system dysfunction, is caused by a woman's use of alcohol during pregnancy. Caffeine, found in tea, coffee, soft drinks and chocolate, should also be limited. Be sure to read labels when trying to cut down on caffeine during pregnancy. More than 200 foods, beverages, and over-the-counter medications contain caffeine and have a high salt content! There are coffees and teas that do not contain caffeine.  M Medical conditions such as diabetes, epilepsy, and high blood pressure should be treated and kept under control before pregnancy when possible, but especially during pregnancy. Ask your caregiver about any medications that may need to be changed or adjusted during pregnancy. If you are currently taking any medications, ask your caregiver if it is safe to take them while you are pregnant or before getting pregnant when possible. Also, be sure to discuss any herbs or vitamins you are taking. They are medicines, too! Discuss with your doctor all medications, prescribed and over-the-counter, that you are taking. During your prenatal visit, discuss the medications your doctor may give you during labor and delivery. N Never be afraid to ask your doctor or caregiver questions about your health, the progress of the pregnancy, family problems, stressful situations, and recommendation for a pediatrician, if you do not have one. It is better to take all precautions and discuss any questions or concerns you may have during your office visits. It is a good idea to write down your questions before you visit the doctor. O Over-the-counter cough and cold remedies may contain alcohol or other ingredients that should be avoided during pregnancy. Ask your caregiver about prescription, herbs or over-the-counter medications that you are taking or may consider taking while pregnant.  P Physical activity during pregnancy can benefit both you and  your baby by lessening discomfort and fatigue, providing a sense of well-being, and increasing the likelihood of early recovery after delivery. Light to moderate exercise during pregnancy strengthens the belly (abdominal) and back muscles. This helps improve posture. Practicing yoga, walking, swimming, and cycling on a stationary bicycle are usually safe exercises for pregnant women. Avoid scuba diving, exercise at high altitudes (over 3000 feet), skiing, horseback riding, contact sports, etc. Always check with your doctor before beginning any kind of exercise, especially during pregnancy and especially if you did not exercise before getting pregnant. Q Queasiness, stomach upset and morning sickness are common during pregnancy. Eating a couple of crackers or dry toast before getting out of bed. Foods that you normally love may make you feel sick to your stomach. You may need to substitute other nutritious foods. Eating 5 or 6 small meals a day instead of 3 large ones may make you feel better. Do not drink with your meals, drink between meals. Questions that you have should be written down and asked during your prenatal visits. R Read about and make plans to baby-proof your home. There are important tips for making your home a safer environment for your baby. Review the tips and make your home safer for you and your baby. Read food labels regarding calories, salt and fat content in the food. S Saunas, hot tubs, and steam rooms should be avoided while you are pregnant. Excessive high heat may be harmful during your pregnancy. Your caregiver will screen and examine you for sexually transmitted diseases and genetic disorders during your prenatal visits. Learn the signs of labor. Sexual relations while pregnant is safe unless there is a medical or pregnancy problem and your caregiver advises against it. T Traveling long distances should be avoided especially in the third trimester of your pregnancy. If you do  have to travel out of state, be sure to take a copy of your medical records and medical insurance plan with you. You should not travel long distances without seeing your doctor first. Most airlines will not allow you to travel after 36 weeks of pregnancy. Toxoplasmosis is an infection caused by a  parasite that can seriously harm an unborn baby. Avoid eating undercooked meat and handling cat litter. Be sure to wear gloves when gardening. Tingling of the hands and fingers is not unusual and is due to fluid retention. This will go away after the baby is born. U Womb (uterus) size increases during the first trimester. Your kidneys will begin to function more efficiently. This may cause you to feel the need to urinate more often. You may also leak urine when sneezing, coughing or laughing. This is due to the growing uterus pressing against your bladder, which lies directly in front of and slightly under the uterus during the first few months of pregnancy. If you experience burning along with frequency of urination or bloody urine, be sure to tell your doctor. The size of your uterus in the third trimester may cause a problem with your balance. It is advisable to maintain good posture and avoid wearing high heels during this time. An ultrasound of your baby may be necessary during your pregnancy and is safe for you and your baby. V Vaccinations are an important concern for pregnant women. Get needed vaccines before pregnancy. Center for Disease Control ([employer]) has clear guidelines for the use of vaccines during pregnancy. Review the list, be sure to discuss it with your doctor. Prenatal vitamins are helpful and healthy for you and the baby. Do not take extra vitamins except what is recommended. Taking too much of certain vitamins can cause overdose problems. Continuous vomiting should be reported to your caregiver. Varicose veins may appear especially if there is a family history of varicose veins. They should  subside after the delivery of the baby. Support hose helps if there is leg discomfort. W Being overweight or underweight during pregnancy may cause problems. Try to get within 15 pounds of your ideal weight before pregnancy. Remember, pregnancy is not a time to be dieting! Do not stop eating or start skipping meals as your weight increases. Both you and your baby need the calories and nutrition you receive from a healthy diet. Be sure to consult with your doctor about your diet. There is a formula and diet plan available depending on whether you are overweight or underweight. Your caregiver or nutritionist can help and advise you if necessary. X Avoid X-rays. If you must have dental work or diagnostic tests, tell your dentist or physician that you are pregnant so that extra care can be taken. X-rays should only be taken when the risks of not taking them outweigh the risk of taking them. If needed, only the minimum amount of radiation should be used. When X-rays are necessary, protective lead shields should be used to cover areas of the body that are not being X-rayed. Y Your baby loves you. Breastfeeding your baby creates a loving and very close bond between the two of you. Give your baby a healthy environment to live in while you are pregnant. Infants and children require constant care and guidance. Their health and safety should be carefully watched at all times. After the baby is born, rest or take a nap when the baby is sleeping. Z Get your ZZZs. Be sure to get plenty of rest. Resting on your side as often as possible, especially on your left side is advised. It provides the best circulation to your baby and helps reduce swelling. Try taking a nap for 30 to 45 minutes in the afternoon when possible. After the baby is born rest or take a nap when the baby is  sleeping. Try elevating your feet for that amount of time when possible. It helps the circulation in your legs and helps reduce swelling.  Most  information courtesy of the CDC. Document Released: 05/31/2005 Document Revised: 05/20/2011 Document Reviewed: 02/12/2009 [facility] Patient Information 2012 [place], [place].ABCs of Pregnancy A Antepartum care is very important. Be sure you see your doctor and get prenatal care as soon as you think you are pregnant. At this time, you will be tested for infection, genetic abnormalities and potential problems with you and the pregnancy. This is the time to discuss diet, exercise, work, medications, labor, pain medication during labor and the possibility of a cesarean delivery. Ask any questions that may concern you. It is important to see your doctor regularly throughout your pregnancy. Avoid exposure to toxic substances and chemicals - such as cleaning solvents, lead and mercury, some insecticides, and paint. Pregnant women should avoid exposure to paint fumes, and fumes that cause you to feel ill, dizzy or faint. When possible, it is a good idea to have a pre-pregnancy consultation with your caregiver to begin some important recommendations your caregiver suggests such as, taking folic acid, exercising, quitting smoking, avoiding alcoholic beverages, etc. B Breastfeeding is the healthiest choice for both you and your baby. It has many nutritional benefits for the baby and health benefits for the mother. It also creates a very tight and loving bond between the baby and mother. Talk to your doctor, your family and friends, and your employer about how you choose to feed your baby and how they can support you in your decision. Not all birth defects can be prevented, but a woman can take actions that may increase her chance of having a healthy baby. Many birth defects happen very early in pregnancy, sometimes before a woman even knows she is pregnant. Birth defects or abnormalities of any child in your or the father's family should be discussed with your caregiver. Get a good support bra as your breast size changes.  Wear it especially when you exercise and when nursing.  C Celebrate the news of your pregnancy with the your spouse/father and family. Childbirth classes are helpful to take for you and the spouse/father because it helps to understand what happens during the pregnancy, labor and delivery. Cesarean delivery should be discussed with your doctor so you are prepared for that possibility. The pros and cons of circumcision if it is a boy, should be discussed with your pediatrician. Cigarette smoking during pregnancy can result in low birth weight babies. It has been associated with infertility, miscarriages, tubal pregnancies, infant death (mortality) and poor health (morbidity) in childhood. Additionally, cigarette smoking may cause long-term learning disabilities. If you smoke, you should try to quit before getting pregnant and not smoke during the pregnancy. Secondary smoke may also harm a mother and her developing baby. It is a good idea to ask people to stop smoking around you during your pregnancy and after the baby is born. Extra calcium is necessary when you are pregnant and is found in your prenatal vitamin, in dairy products, green leafy vegetables and in calcium supplements. D A healthy diet according to your current weight and height, along with vitamins and mineral supplements should be discussed with your caregiver. Domestic abuse or violence should be made known to your doctor right away to get the situation corrected. Drink more water when you exercise to keep hydrated. Discomfort of your back and legs usually develops and progresses from the middle of the second trimester  through to delivery of the baby. This is because of the enlarging baby and uterus, which may also affect your balance. Do not take illegal drugs. Illegal drugs can seriously harm the baby and you. Drink extra fluids (water is best) throughout pregnancy to help your body keep up with the increases in your blood volume. Drink at least  6 to 8 glasses of water, fruit juice, or milk each day. A good way to know you are drinking enough fluid is when your urine looks almost like clear water or is very light yellow.  E Eat healthy to get the nutrients you and your unborn baby need. Your meals should include the five basic food groups. Exercise (30 minutes of light to moderate exercise a day) is important and encouraged during pregnancy, if there are no medical problems or problems with the pregnancy. Exercise that causes discomfort or dizziness should be stopped and reported to your caregiver. Emotions during pregnancy can change from being ecstatic to depression and should be understood by you, your partner and your family. F Fetal screening with ultrasound, amniocentesis and monitoring during pregnancy and labor is common and sometimes necessary. Take 400 micrograms of folic acid daily both before, when possible, and during the first few months of pregnancy to reduce the risk of birth defects of the brain and spine. All women who could possibly become pregnant should take a vitamin with folic acid, every day. It is also important to eat a healthy diet with fortified foods (enriched grain products, including cereals, rice, breads, and pastas) and foods with natural sources of folate (orange juice, green leafy vegetables, beans, peanuts, broccoli, asparagus, peas, and lentils). The father should be involved with all aspects of the pregnancy including, the prenatal care, childbirth classes, labor, delivery, and postpartum time. Fathers may also have emotional concerns about being a father, financial needs, and raising a family. G Genetic testing should be done appropriately. It is important to know your family and the father's history. If there have been problems with pregnancies or birth defects in your family, report these to your doctor. Also, genetic counselors can talk with you about the information you might need in making decisions about  having a family. You can call a major medical center in your area for help in finding a board-certified genetic counselor. Genetic testing and counseling should be done before pregnancy when possible, especially if there is a history of problems in the mother's or father's family. Certain ethnic backgrounds are more at risk for genetic defects. H Get familiar with the hospital where you will be having your baby. Get to know how long it takes to get there, the labor and delivery area, and the hospital procedures. Be sure your medical insurance is accepted there. Get your home ready for the baby including, clothes, the baby's room (when possible), furniture and car seat. Hand washing is important throughout the day, especially after handling raw meat and poultry, changing the baby's diaper or using the bathroom. This can help prevent the spread of many bacteria and viruses that cause infection. Your hair may become dry and thinner, but will return to normal a few weeks after the baby is born. Heartburn is a common problem that can be treated by taking antacids recommended by your caregiver, eating smaller meals 5 or 6 times a day, not drinking liquids when eating, drinking between meals and raising the head of your bed 2 to 3 inches. I Insurance to cover you, the baby, doctor and  hospital should be reviewed so that you will be prepared to pay any costs not covered by your insurance plan. If you do not have medical insurance, there are usually clinics and services available for you in your community. Take 30 milligrams of iron during your pregnancy as prescribed by your doctor to reduce the risk of low red blood cells (anemia) later in pregnancy. All women of childbearing age should eat a diet rich in iron. J There should be a joint effort for the mother, father and any other children to adapt to the pregnancy financially, emotionally, and psychologically during the pregnancy. Join a support group for moms-to-be.  Or, join a class on parenting or childbirth. Have the family participate when possible. K Know your limits. Let your caregiver know if you experience any of the following:   Pain of any kind.   Strong cramps.   You develop a lot of weight in a short period of time (5 pounds in 3 to 5 days).   Vaginal bleeding, leaking of amniotic fluid.   Headache, vision problems.   Dizziness, fainting, shortness of breath.   Chest pain.   Fever of 102 F (38.9 C) or higher.   Gush of clear fluid from your vagina.   Painful urination.   Domestic violence.   Irregular heartbeat (palpitations).   Rapid beating of the heart (tachycardia).   Constant feeling sick to your stomach (nauseous) and vomiting.   Trouble walking, fluid retention (edema).   Muscle weakness.   If your baby has decreased activity.   Persistent diarrhea.   Abnormal vaginal discharge.   Uterine contractions at 20-minute intervals.   Back pain that travels down your leg.  L Learn and practice that what you eat and drink should be in moderation and healthy for you and your baby. Legal drugs such as alcohol and caffeine are important issues for pregnant women. There is no safe amount of alcohol a woman can drink while pregnant. Fetal alcohol syndrome, a disorder characterized by growth retardation, facial abnormalities, and central nervous system dysfunction, is caused by a woman's use of alcohol during pregnancy. Caffeine, found in tea, coffee, soft drinks and chocolate, should also be limited. Be sure to read labels when trying to cut down on caffeine during pregnancy. More than 200 foods, beverages, and over-the-counter medications contain caffeine and have a high salt content! There are coffees and teas that do not contain caffeine. M Medical conditions such as diabetes, epilepsy, and high blood pressure should be treated and kept under control before pregnancy when possible, but especially during pregnancy. Ask  your caregiver about any medications that may need to be changed or adjusted during pregnancy. If you are currently taking any medications, ask your caregiver if it is safe to take them while you are pregnant or before getting pregnant when possible. Also, be sure to discuss any herbs or vitamins you are taking. They are medicines, too! Discuss with your doctor all medications, prescribed and over-the-counter, that you are taking. During your prenatal visit, discuss the medications your doctor may give you during labor and delivery. N Never be afraid to ask your doctor or caregiver questions about your health, the progress of the pregnancy, family problems, stressful situations, and recommendation for a pediatrician, if you do not have one. It is better to take all precautions and discuss any questions or concerns you may have during your office visits. It is a good idea to write down your questions before you visit the  doctor. O Over-the-counter cough and cold remedies may contain alcohol or other ingredients that should be avoided during pregnancy. Ask your caregiver about prescription, herbs or over-the-counter medications that you are taking or may consider taking while pregnant.  P Physical activity during pregnancy can benefit both you and your baby by lessening discomfort and fatigue, providing a sense of well-being, and increasing the likelihood of early recovery after delivery. Light to moderate exercise during pregnancy strengthens the belly (abdominal) and back muscles. This helps improve posture. Practicing yoga, walking, swimming, and cycling on a stationary bicycle are usually safe exercises for pregnant women. Avoid scuba diving, exercise at high altitudes (over 3000 feet), skiing, horseback riding, contact sports, etc. Always check with your doctor before beginning any kind of exercise, especially during pregnancy and especially if you did not exercise before getting pregnant. Q Queasiness,  stomach upset and morning sickness are common during pregnancy. Eating a couple of crackers or dry toast before getting out of bed. Foods that you normally love may make you feel sick to your stomach. You may need to substitute other nutritious foods. Eating 5 or 6 small meals a day instead of 3 large ones may make you feel better. Do not drink with your meals, drink between meals. Questions that you have should be written down and asked during your prenatal visits. R Read about and make plans to baby-proof your home. There are important tips for making your home a safer environment for your baby. Review the tips and make your home safer for you and your baby. Read food labels regarding calories, salt and fat content in the food. S Saunas, hot tubs, and steam rooms should be avoided while you are pregnant. Excessive high heat may be harmful during your pregnancy. Your caregiver will screen and examine you for sexually transmitted diseases and genetic disorders during your prenatal visits. Learn the signs of labor. Sexual relations while pregnant is safe unless there is a medical or pregnancy problem and your caregiver advises against it. T Traveling long distances should be avoided especially in the third trimester of your pregnancy. If you do have to travel out of state, be sure to take a copy of your medical records and medical insurance plan with you. You should not travel long distances without seeing your doctor first. Most airlines will not allow you to travel after 36 weeks of pregnancy. Toxoplasmosis is an infection caused by a parasite that can seriously harm an unborn baby. Avoid eating undercooked meat and handling cat litter. Be sure to wear gloves when gardening. Tingling of the hands and fingers is not unusual and is due to fluid retention. This will go away after the baby is born. U Womb (uterus) size increases during the first trimester. Your kidneys will begin to function more efficiently.  This may cause you to feel the need to urinate more often. You may also leak urine when sneezing, coughing or laughing. This is due to the growing uterus pressing against your bladder, which lies directly in front of and slightly under the uterus during the first few months of pregnancy. If you experience burning along with frequency of urination or bloody urine, be sure to tell your doctor. The size of your uterus in the third trimester may cause a problem with your balance. It is advisable to maintain good posture and avoid wearing high heels during this time. An ultrasound of your baby may be necessary during your pregnancy and is safe for you and your  baby. V Vaccinations are an important concern for pregnant women. Get needed vaccines before pregnancy. Center for Disease Control (FootballExhibition.com.br) has clear guidelines for the use of vaccines during pregnancy. Review the list, be sure to discuss it with your doctor. Prenatal vitamins are helpful and healthy for you and the baby. Do not take extra vitamins except what is recommended. Taking too much of certain vitamins can cause overdose problems. Continuous vomiting should be reported to your caregiver. Varicose veins may appear especially if there is a family history of varicose veins. They should subside after the delivery of the baby. Support hose helps if there is leg discomfort. W Being overweight or underweight during pregnancy may cause problems. Try to get within 15 pounds of your ideal weight before pregnancy. Remember, pregnancy is not a time to be dieting! Do not stop eating or start skipping meals as your weight increases. Both you and your baby need the calories and nutrition you receive from a healthy diet. Be sure to consult with your doctor about your diet. There is a formula and diet plan available depending on whether you are overweight or underweight. Your caregiver or nutritionist can help and advise you if necessary. X Avoid X-rays. If you  must have dental work or diagnostic tests, tell your dentist or physician that you are pregnant so that extra care can be taken. X-rays should only be taken when the risks of not taking them outweigh the risk of taking them. If needed, only the minimum amount of radiation should be used. When X-rays are necessary, protective lead shields should be used to cover areas of the body that are not being X-rayed. Y Your baby loves you. Breastfeeding your baby creates a loving and very close bond between the two of you. Give your baby a healthy environment to live in while you are pregnant. Infants and children require constant care and guidance. Their health and safety should be carefully watched at all times. After the baby is born, rest or take a nap when the baby is sleeping. Z Get your ZZZs. Be sure to get plenty of rest. Resting on your side as often as possible, especially on your left side is advised. It provides the best circulation to your baby and helps reduce swelling. Try taking a nap for 30 to 45 minutes in the afternoon when possible. After the baby is born rest or take a nap when the baby is sleeping. Try elevating your feet for that amount of time when possible. It helps the circulation in your legs and helps reduce swelling.  Most information courtesy of the CDC. Document Released: 05/31/2005 Document Revised: 05/20/2011 Document Reviewed: 02/12/2009 [facility] Patient Information 2012 [place], [place].  Bacterial Vaginosis Bacterial vaginosis (BV) is a vaginal infection where the normal balance of bacteria in the vagina is disrupted. The normal balance is then replaced by an overgrowth of certain bacteria. There are several different kinds of bacteria that can cause BV. BV is the most common vaginal infection in women of childbearing age. CAUSES   The cause of BV is not fully understood. BV develops when there is an increase or imbalance of harmful bacteria.   Some activities or behaviors can  upset the normal balance of bacteria in the vagina and put women at increased risk including:   Having a new sex partner or multiple sex partners.   Douching.   Using an intrauterine device (IUD) for contraception.   It is not clear what role sexual activity plays  in the development of BV. However, women that have never had sexual intercourse are rarely infected with BV.  Women do not get BV from toilet seats, bedding, swimming pools or from touching objects around them.  SYMPTOMS   Grey vaginal discharge.   A fish-like odor with discharge, especially after sexual intercourse.   Itching or burning of the vagina and vulva.   Burning or pain with urination.   Some women have no signs or symptoms at all.  DIAGNOSIS  Your caregiver must examine the vagina for signs of BV. Your caregiver will perform lab tests and look at the sample of vaginal fluid through a microscope. They will look for bacteria and abnormal cells (clue cells), a pH test higher than 4.5, and a positive amine test all associated with BV.  RISKS AND COMPLICATIONS   Pelvic inflammatory disease (PID).   Infections following gynecology surgery.   Developing HIV.   Developing herpes virus.  TREATMENT  Sometimes BV will clear up without treatment. However, all women with symptoms of BV should be treated to avoid complications, especially if gynecology surgery is planned. Female partners generally do not need to be treated. However, BV may spread between female sex partners so treatment is helpful in preventing a recurrence of BV.   BV may be treated with antibiotics. The antibiotics come in either pill or vaginal cream forms. Either can be used with nonpregnant or pregnant women, but the recommended dosages differ. These antibiotics are not harmful to the baby.   BV can recur after treatment. If this happens, a second round of antibiotics will often be prescribed.   Treatment is important for pregnant women. If not  treated, BV can cause a premature delivery, especially for a pregnant woman who had a premature birth in the past. All pregnant women who have symptoms of BV should be checked and treated.   For chronic reoccurrence of BV, treatment with a type of prescribed gel vaginally twice a week is helpful.  HOME CARE INSTRUCTIONS   Finish all medication as directed by your caregiver.   Do not have sex until treatment is completed.   Tell your sexual partner that you have a vaginal infection. They should see their caregiver and be treated if they have problems, such as a mild rash or itching.   Practice safe sex. Use condoms. Only have 1 sex partner.  PREVENTION  Basic prevention steps can help reduce the risk of upsetting the natural balance of bacteria in the vagina and developing BV:  Do not have sexual intercourse (be abstinent).   Do not douche.   Use all of the medicine prescribed for treatment of BV, even if the signs and symptoms go away.   Tell your sex partner if you have BV. That way, they can be treated, if needed, to prevent reoccurrence.  SEEK MEDICAL CARE IF:   Your symptoms are not improving after 3 days of treatment.   You have increased discharge, pain, or fever.  MAKE SURE YOU:   Understand these instructions.   Will watch your condition.   Will get help right away if you are not doing well or get worse.  FOR MORE INFORMATION  Division of STD Prevention (DSTDP), Centers for Disease Control and Prevention: SolutionApps.co.za American Social Health Association (ASHA): www.ashastd.org  Document Released: 05/31/2005 Document Revised: 05/20/2011 Document Reviewed: 11/21/2008 [facility] Patient Information 2012 [place], [place].

## 2012-02-10 LAB — GC/CHLAMYDIA PROBE AMP, GENITAL
Chlamydia, DNA Probe: NEGATIVE
GC Probe Amp, Genital: NEGATIVE

## 2012-02-10 NOTE — MAU Provider Note (Signed)
Attestation of Attending Supervision of Advanced Practitioner: Evaluation and management procedures were performed by the PA/NP/CNM/OB Fellow under my supervision/collaboration. Chart reviewed and agree with management and plan.  Kabao Leite V 02/10/2012 4:22 AM    

## 2012-04-21 LAB — OB RESULTS CONSOLE ABO/RH: RH Type: POSITIVE

## 2012-04-21 LAB — OB RESULTS CONSOLE HIV ANTIBODY (ROUTINE TESTING): HIV: NONREACTIVE

## 2012-04-21 LAB — OB RESULTS CONSOLE ANTIBODY SCREEN: Antibody Screen: NEGATIVE

## 2012-04-21 LAB — OB RESULTS CONSOLE RUBELLA ANTIBODY, IGM: Rubella: IMMUNE

## 2012-04-28 ENCOUNTER — Encounter (HOSPITAL_COMMUNITY): Payer: Self-pay | Admitting: *Deleted

## 2012-04-28 ENCOUNTER — Inpatient Hospital Stay (HOSPITAL_COMMUNITY): Payer: No Typology Code available for payment source

## 2012-04-28 ENCOUNTER — Inpatient Hospital Stay (HOSPITAL_COMMUNITY)
Admission: EM | Admit: 2012-04-28 | Discharge: 2012-04-28 | Disposition: A | Discharge status: Other Acute Inpatient Hospital | Payer: No Typology Code available for payment source | Attending: Emergency Medicine | Admitting: Emergency Medicine

## 2012-04-28 DIAGNOSIS — M545 Low back pain, unspecified: Secondary | ICD-10-CM | POA: Insufficient documentation

## 2012-04-28 DIAGNOSIS — Y929 Unspecified place or not applicable: Secondary | ICD-10-CM | POA: Insufficient documentation

## 2012-04-28 DIAGNOSIS — Y939 Activity, unspecified: Secondary | ICD-10-CM | POA: Insufficient documentation

## 2012-04-28 DIAGNOSIS — M542 Cervicalgia: Secondary | ICD-10-CM | POA: Insufficient documentation

## 2012-04-28 DIAGNOSIS — R109 Unspecified abdominal pain: Secondary | ICD-10-CM | POA: Insufficient documentation

## 2012-04-28 DIAGNOSIS — O99891 Other specified diseases and conditions complicating pregnancy: Secondary | ICD-10-CM

## 2012-04-28 DIAGNOSIS — R51 Headache: Secondary | ICD-10-CM | POA: Insufficient documentation

## 2012-04-28 DIAGNOSIS — Z3490 Encounter for supervision of normal pregnancy, unspecified, unspecified trimester: Secondary | ICD-10-CM

## 2012-04-28 HISTORY — DX: Chlamydial infection, unspecified: A74.9

## 2012-04-28 LAB — CBC WITH DIFFERENTIAL/PLATELET
Eosinophils Relative: 4 % (ref 0–5)
HCT: 30.8 % — ABNORMAL LOW (ref 36.0–46.0)
Lymphocytes Relative: 27 % (ref 12–46)
Lymphs Abs: 1.8 10*3/uL (ref 0.7–4.0)
MCV: 75.7 fL — ABNORMAL LOW (ref 78.0–100.0)
Platelets: 230 10*3/uL (ref 150–400)
RBC: 4.07 MIL/uL (ref 3.87–5.11)

## 2012-04-28 LAB — URINALYSIS, ROUTINE W REFLEX MICROSCOPIC
Glucose, UA: NEGATIVE mg/dL
Hgb urine dipstick: NEGATIVE
Specific Gravity, Urine: 1.015 (ref 1.005–1.030)
pH: 7.5 (ref 5.0–8.0)

## 2012-04-28 LAB — BASIC METABOLIC PANEL
CO2: 22 mEq/L (ref 19–32)
Calcium: 9.2 mg/dL (ref 8.4–10.5)
Chloride: 104 mEq/L (ref 96–112)
Glucose, Bld: 82 mg/dL (ref 70–99)
Sodium: 137 mEq/L (ref 135–145)

## 2012-04-28 LAB — URINE MICROSCOPIC-ADD ON

## 2012-04-28 MED ORDER — IBUPROFEN 600 MG PO TABS
600.0000 mg | ORAL_TABLET | Freq: Four times a day (QID) | ORAL | Status: DC | PRN
Start: 2012-04-28 — End: 2012-05-09

## 2012-04-28 MED ORDER — SODIUM CHLORIDE 0.9 % IV SOLN
Freq: Once | INTRAVENOUS | Status: AC
  Administered 2012-04-28: 150 mL/h via INTRAVENOUS

## 2012-04-28 MED ORDER — IBUPROFEN 600 MG PO TABS: 600.0000 mg | ORAL_TABLET | Freq: Four times a day (QID) | ORAL | Status: DC | PRN

## 2012-04-28 NOTE — Progress Notes (Signed)
I introduced myself to pt and asked if she would like me to make any calls for her. She did not need assistance w/calls, etc. Pt was sitting up in bed and talking to staff.  Marjory Lies Chaplain

## 2012-04-28 NOTE — ED Notes (Signed)
Pt remains on fetal monitor, no distress with OB RN at the bedside. [facility] on the way for transport.

## 2012-04-28 NOTE — ED Notes (Signed)
Pt in no distress at this time. Skin warm and dry. Reports normal fetal movement.
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
## 2012-04-28 NOTE — Progress Notes (Signed)
At bedside. Pt reports EDC 08/28/12 and a G2P1 with previous vaginal birth. Pt states she is seen at [facility] with Dr. Clearance Coots. Pt states she is having abd cramping on right side rated a 7/10. Pt denies leaking or vaginal bleeding at this time. ED MD at bedside stating he has contacted OB for possible transfer. No external injuries noted.

## 2012-04-28 NOTE — ED Notes (Signed)
Pt to be transported to [facility] for further evaluation.
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
## 2012-04-28 NOTE — ED Notes (Signed)
RROB-RN at the bedside to place pt on fetal monitor. No distress noted. Pt reports fetal movement and lower abd cramping.
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
## 2012-04-28 NOTE — ED Notes (Signed)
[facility] called for transfer 

## 2012-04-28 NOTE — ED Notes (Signed)
OB Rapid Response Team called and will be here to evaluate pt.
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
## 2012-04-28 NOTE — Progress Notes (Signed)
Report called to [facility] in MAU for pt transport via [facility]
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
## 2012-04-29 LAB — URINE CULTURE

## 2012-05-09 ENCOUNTER — Emergency Department (HOSPITAL_COMMUNITY)
Admission: EM | Admit: 2012-05-09 | Discharge: 2012-05-09 | Disposition: A | Discharge status: Home / Self Care | Payer: Medicaid Other | Attending: Emergency Medicine | Admitting: Emergency Medicine

## 2012-05-09 ENCOUNTER — Encounter (HOSPITAL_COMMUNITY): Payer: Self-pay | Admitting: *Deleted

## 2012-05-09 DIAGNOSIS — M545 Low back pain, unspecified: Secondary | ICD-10-CM | POA: Insufficient documentation

## 2012-05-09 DIAGNOSIS — Z8619 Personal history of other infectious and parasitic diseases: Secondary | ICD-10-CM | POA: Insufficient documentation

## 2012-05-09 DIAGNOSIS — M542 Cervicalgia: Secondary | ICD-10-CM | POA: Insufficient documentation

## 2012-05-09 DIAGNOSIS — Z79899 Other long term (current) drug therapy: Secondary | ICD-10-CM | POA: Insufficient documentation

## 2012-05-09 DIAGNOSIS — F17200 Nicotine dependence, unspecified, uncomplicated: Secondary | ICD-10-CM | POA: Insufficient documentation

## 2012-05-09 DIAGNOSIS — O9989 Other specified diseases and conditions complicating pregnancy, childbirth and the puerperium: Secondary | ICD-10-CM | POA: Insufficient documentation

## 2012-05-09 MED ORDER — IBUPROFEN 600 MG PO TABS: 600.0000 mg | ORAL_TABLET | Freq: Three times a day (TID) | ORAL | Status: DC | PRN

## 2012-05-09 NOTE — ED Notes (Signed)
NP at bedside.
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
## 2012-05-09 NOTE — ED Provider Notes (Signed)
Medical screening examination/treatment/procedure(s) were performed by non-physician practitioner and as supervising physician I was immediately available for consultation/collaboration.   Camerin Ladouceur L Dodi Leu, MD 05/09/12 0342 
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
## 2012-06-14 NOTE — L&D Delivery Note (Signed)
Delivery Note At 2:33 AM a viable female was delivered via Vaginal, Spontaneous Delivery (Presentation: ROA;  ).  APGAR: 8, 9; weight: 2800 grams .   Placenta status: Intact, Spontaneous.  Cord: 3 vessels with the following complications: None.  Cord pH: none Uterine atony after expulsion of placenta.  Responded well to massage, IV Pitocin  and Methergine IM.  Anesthesia: None  Episiotomy: None Lacerations: None Suture Repair: None Est. Blood Loss (mL): 500  Mom to postpartum.  Baby to nursery-stable.  Ignacia Gentzler A 09/02/2012, 3:18 AM

## 2012-08-25 ENCOUNTER — Inpatient Hospital Stay (HOSPITAL_COMMUNITY)
Admission: AD | Admit: 2012-08-25 | Discharge: 2012-08-25 | Disposition: A | Discharge status: Home / Self Care | Payer: Medicaid Other | Source: Ambulatory Visit | Attending: Obstetrics & Gynecology | Admitting: Obstetrics & Gynecology

## 2012-08-25 ENCOUNTER — Encounter (HOSPITAL_COMMUNITY): Payer: Self-pay | Admitting: Family

## 2012-08-25 DIAGNOSIS — O0930 Supervision of pregnancy with insufficient antenatal care, unspecified trimester: Secondary | ICD-10-CM | POA: Insufficient documentation

## 2012-08-25 DIAGNOSIS — O99891 Other specified diseases and conditions complicating pregnancy: Secondary | ICD-10-CM | POA: Insufficient documentation

## 2012-08-25 LAB — GROUP B STREP BY PCR: Group B strep by PCR: NEGATIVE

## 2012-08-25 LAB — POCT FERN TEST: POCT Fern Test: NEGATIVE

## 2012-08-25 NOTE — MAU Note (Signed)
Patient presents to MAU with possible ROM; states she had a gush of clear fluid at 0930 today, although no fluid noted since. Denies vaginal bleeding, cramping or contractions. Reports good fetal movement.

## 2012-08-25 NOTE — MAU Provider Note (Signed)
S: 22 y.o. G2P1001 @[redacted]w[redacted]d  presents to MAU with leakage of fluid x1 episode that occurred while standing in the bathroom this morning.  Clear fluid was running down her leg but not onto the floor per the pt description. The pt immediately got into the shower and has not reported any leakage since she finished her shower.  She is not wearing a pad upon arrival to MAU.  She reports good fetal movement, denies regular contrations, vaginal bleeding, vaginal itching/burning, urinary symptoms, h/a, dizziness, n/v, or fever/chills.    She reports she has not had a prenatal appointment since January because she wants to see another MD besides Dr Clearance Coots.    O: BP 110/73  Pulse 96  Temp(Src) 96.8 F (36 C) (Oral)  Resp 18  Ht 5' 5.5" (1.664 m)  Wt 100.699 kg (222 lb)  BMI 36.37 kg/m2  LMP 11/21/2011  Speculum exam with negative pooling with cough/bearing down  Ferning negative  A: Threatened labor at term Inadequate prenatal care  P: RN to Call Dr Tamela Oddi with results Pt not aware that another MD works with Dr Clearance Coots, is interested in seeing Dr Tamela Oddi if d/c home today Pt to make appointment as soon as possible with Dr Tamela Oddi for prenatal visit  Sharen Counter Certified Nurse-Midwife

## 2012-08-25 NOTE — MAU Note (Signed)
Patient states she had a little leaking of clear fluid at 0900, none since. Denies contractions and reports good fetal movement.

## 2012-08-27 ENCOUNTER — Encounter (HOSPITAL_COMMUNITY): Payer: Self-pay | Admitting: *Deleted

## 2012-08-27 ENCOUNTER — Inpatient Hospital Stay (HOSPITAL_COMMUNITY)
Admission: AD | Admit: 2012-08-27 | Discharge: 2012-08-27 | Disposition: A | Discharge status: Home / Self Care | Payer: Medicaid Other | Source: Ambulatory Visit | Attending: Obstetrics & Gynecology | Admitting: Obstetrics & Gynecology

## 2012-08-27 DIAGNOSIS — R109 Unspecified abdominal pain: Secondary | ICD-10-CM | POA: Insufficient documentation

## 2012-08-27 DIAGNOSIS — O26859 Spotting complicating pregnancy, unspecified trimester: Secondary | ICD-10-CM | POA: Insufficient documentation

## 2012-08-27 LAB — URINALYSIS, ROUTINE W REFLEX MICROSCOPIC
Nitrite: NEGATIVE
Protein, ur: NEGATIVE mg/dL
Specific Gravity, Urine: 1.015 (ref 1.005–1.030)
Urobilinogen, UA: 0.2 mg/dL (ref 0.0–1.0)

## 2012-08-27 LAB — URINE MICROSCOPIC-ADD ON

## 2012-08-27 NOTE — MAU Note (Signed)
Pt presents with complaints of bright red vaginal bleeding and cramping. She says that she notices it when she uses the bathroom and when she wipes it is on the toilet paper.

## 2012-09-01 ENCOUNTER — Inpatient Hospital Stay (HOSPITAL_COMMUNITY)
Admission: AD | Admit: 2012-09-01 | Discharge: 2012-09-01 | Disposition: A | Discharge status: Home / Self Care | Payer: Medicaid Other | Source: Ambulatory Visit | Attending: Obstetrics & Gynecology | Admitting: Obstetrics & Gynecology

## 2012-09-01 ENCOUNTER — Encounter (HOSPITAL_COMMUNITY): Payer: Self-pay | Admitting: *Deleted

## 2012-09-01 DIAGNOSIS — O2390 Unspecified genitourinary tract infection in pregnancy, unspecified trimester: Secondary | ICD-10-CM | POA: Insufficient documentation

## 2012-09-01 DIAGNOSIS — B9689 Other specified bacterial agents as the cause of diseases classified elsewhere: Secondary | ICD-10-CM | POA: Insufficient documentation

## 2012-09-01 DIAGNOSIS — O479 False labor, unspecified: Secondary | ICD-10-CM | POA: Insufficient documentation

## 2012-09-01 DIAGNOSIS — N949 Unspecified condition associated with female genital organs and menstrual cycle: Secondary | ICD-10-CM | POA: Insufficient documentation

## 2012-09-01 DIAGNOSIS — N760 Acute vaginitis: Secondary | ICD-10-CM | POA: Insufficient documentation

## 2012-09-01 DIAGNOSIS — A499 Bacterial infection, unspecified: Secondary | ICD-10-CM | POA: Insufficient documentation

## 2012-09-01 LAB — WET PREP, GENITAL: Trich, Wet Prep: NONE SEEN

## 2012-09-01 MED ORDER — METRONIDAZOLE 500 MG PO TABS: 500.0000 mg | ORAL_TABLET | Freq: Two times a day (BID) | ORAL | Status: DC

## 2012-09-01 NOTE — MAU Note (Signed)
Patient states she is having contractions every 5-6 minutes with light discharge. Denies bleeding. Reports good fetal movement.

## 2012-09-01 NOTE — MAU Provider Note (Signed)
Pt here for labor check, asked by RN for spec exam d/t pt c/o discharge. Pt reports thick, malodorous discharge.  Spec: no pooling, creamy malodorous discharge NST reactive, UCs irreg  Results for orders placed during the hospital encounter of 09/01/12 (from the past 24 hour(s))  WET PREP, GENITAL     Status: Abnormal   Collection Time    09/01/12  5:20 PM      Result Value Range   Yeast Wet Prep HPF POC NONE SEEN  NONE SEEN   Trich, Wet Prep NONE SEEN  NONE SEEN   Clue Cells Wet Prep HPF POC FEW (*) NONE SEEN   WBC, Wet Prep HPF POC MANY (*) NONE SEEN    Rx sent for Flagyl for BV RN to complete labor eval

## 2012-09-01 NOTE — MAU Note (Signed)
C/o ucs since 0900 this AM;

## 2012-09-02 ENCOUNTER — Inpatient Hospital Stay (HOSPITAL_COMMUNITY)
Admission: AD | Admit: 2012-09-02 | Discharge: 2012-09-04 | DRG: 775 | Disposition: A | Discharge status: Home / Self Care | Payer: Medicaid Other | Source: Ambulatory Visit | Attending: Obstetrics | Admitting: Obstetrics

## 2012-09-02 ENCOUNTER — Encounter (HOSPITAL_COMMUNITY): Payer: Self-pay | Admitting: *Deleted

## 2012-09-02 HISTORY — DX: Other specified health status: Z78.9

## 2012-09-02 LAB — CBC
HCT: 31.9 % — ABNORMAL LOW (ref 36.0–46.0)
HCT: 29.1 % — ABNORMAL LOW (ref 36.0–46.0)
Hemoglobin: 10.7 g/dL — ABNORMAL LOW (ref 12.0–15.0)
Hemoglobin: 9.7 g/dL — ABNORMAL LOW (ref 12.0–15.0)
MCH: 25.3 pg — ABNORMAL LOW (ref 26.0–34.0)
MCHC: 33.5 g/dL (ref 30.0–36.0)
MCV: 75.4 fL — ABNORMAL LOW (ref 78.0–100.0)
MCV: 76.2 fL — ABNORMAL LOW (ref 78.0–100.0)
Platelets: 202 10*3/uL (ref 150–400)
RBC: 4.23 MIL/uL (ref 3.87–5.11)
WBC: 12.7 10*3/uL — ABNORMAL HIGH (ref 4.0–10.5)
WBC: 13 10*3/uL — ABNORMAL HIGH (ref 4.0–10.5)

## 2012-09-02 LAB — RPR: RPR Ser Ql: NONREACTIVE

## 2012-09-02 MED ORDER — METHYLERGONOVINE MALEATE 0.2 MG/ML IJ SOLN: 0.2000 mg | Freq: Four times a day (QID) | INTRAMUSCULAR | Status: AC

## 2012-09-02 MED ORDER — ONDANSETRON HCL 4 MG/2ML IJ SOLN: 4.0000 mg | Freq: Four times a day (QID) | INTRAMUSCULAR | Status: DC | PRN

## 2012-09-02 MED ORDER — FLEET ENEMA 7-19 GM/118ML RE ENEM: 1.0000 | ENEMA | RECTAL | Status: DC | PRN

## 2012-09-02 MED ORDER — METHYLERGONOVINE MALEATE 0.2 MG/ML IJ SOLN
INTRAMUSCULAR | Status: AC
  Filled 2012-09-02: qty 1

## 2012-09-02 MED ORDER — SODIUM CHLORIDE 0.9 % IV SOLN
2.0000 g | Freq: Once | INTRAVENOUS | Status: AC
  Administered 2012-09-02: 2 g via INTRAVENOUS
  Filled 2012-09-02: qty 2000

## 2012-09-02 MED ORDER — LANOLIN HYDROUS EX OINT: TOPICAL_OINTMENT | CUTANEOUS | Status: DC | PRN

## 2012-09-02 MED ORDER — IBUPROFEN 600 MG PO TABS
600.0000 mg | ORAL_TABLET | Freq: Four times a day (QID) | ORAL | Status: DC
  Administered 2012-09-02 – 2012-09-04 (×9): 600 mg via ORAL
  Filled 2012-09-02 (×9): qty 1

## 2012-09-02 MED ORDER — LACTATED RINGERS IV SOLN: 500.0000 mL | INTRAVENOUS | Status: DC | PRN

## 2012-09-02 MED ORDER — OXYTOCIN 40 UNITS IN LACTATED RINGERS INFUSION - SIMPLE MED: 62.5000 mL/h | INTRAVENOUS | Status: DC | PRN

## 2012-09-02 MED ORDER — ACETAMINOPHEN 325 MG PO TABS: 650.0000 mg | ORAL_TABLET | ORAL | Status: DC | PRN

## 2012-09-02 MED ORDER — DIBUCAINE 1 % RE OINT: 1.0000 "application " | TOPICAL_OINTMENT | RECTAL | Status: DC | PRN

## 2012-09-02 MED ORDER — OXYTOCIN BOLUS FROM INFUSION
500.0000 mL | INTRAVENOUS | Status: DC
  Administered 2012-09-02: 500 mL via INTRAVENOUS

## 2012-09-02 MED ORDER — OXYTOCIN 10 UNIT/ML IJ SOLN
INTRAMUSCULAR | Status: AC
  Filled 2012-09-02: qty 1

## 2012-09-02 MED ORDER — LIDOCAINE HCL (PF) 1 % IJ SOLN
30.0000 mL | INTRAMUSCULAR | Status: DC | PRN
  Filled 2012-09-02: qty 30

## 2012-09-02 MED ORDER — TETANUS-DIPHTH-ACELL PERTUSSIS 5-2.5-18.5 LF-MCG/0.5 IM SUSP
0.5000 mL | Freq: Once | INTRAMUSCULAR | Status: DC
  Filled 2012-09-02: qty 0.5

## 2012-09-02 MED ORDER — METHYLERGONOVINE MALEATE 0.2 MG/ML IJ SOLN
0.2000 mg | Freq: Once | INTRAMUSCULAR | Status: AC
  Administered 2012-09-02: 0.2 mg via INTRAMUSCULAR

## 2012-09-02 MED ORDER — BENZOCAINE-MENTHOL 20-0.5 % EX AERO
1.0000 "application " | INHALATION_SPRAY | CUTANEOUS | Status: DC | PRN
  Administered 2012-09-02: 1 via TOPICAL
  Filled 2012-09-02: qty 56

## 2012-09-02 MED ORDER — LIDOCAINE HCL (PF) 1 % IJ SOLN
INTRAMUSCULAR | Status: AC
  Filled 2012-09-02: qty 30

## 2012-09-02 MED ORDER — WITCH HAZEL-GLYCERIN EX PADS: 1.0000 "application " | MEDICATED_PAD | CUTANEOUS | Status: DC | PRN

## 2012-09-02 MED ORDER — ZOLPIDEM TARTRATE 5 MG PO TABS: 5.0000 mg | ORAL_TABLET | Freq: Every evening | ORAL | Status: DC | PRN

## 2012-09-02 MED ORDER — PRENATAL MULTIVITAMIN CH
1.0000 | ORAL_TABLET | Freq: Every day | ORAL | Status: DC
  Administered 2012-09-03: 1 via ORAL
  Filled 2012-09-02 (×2): qty 1

## 2012-09-02 MED ORDER — OXYTOCIN 40 UNITS IN LACTATED RINGERS INFUSION - SIMPLE MED
INTRAVENOUS | Status: AC
  Administered 2012-09-02: 40 [IU]
  Filled 2012-09-02: qty 1000

## 2012-09-02 MED ORDER — OXYCODONE-ACETAMINOPHEN 5-325 MG PO TABS
1.0000 | ORAL_TABLET | ORAL | Status: DC | PRN
  Administered 2012-09-02 – 2012-09-03 (×2): 1 via ORAL
  Filled 2012-09-02 (×2): qty 1

## 2012-09-02 MED ORDER — ONDANSETRON HCL 4 MG/2ML IJ SOLN: 4.0000 mg | INTRAMUSCULAR | Status: DC | PRN

## 2012-09-02 MED ORDER — ONDANSETRON HCL 4 MG PO TABS: 4.0000 mg | ORAL_TABLET | ORAL | Status: DC | PRN

## 2012-09-02 MED ORDER — METHYLERGONOVINE MALEATE 0.2 MG PO TABS
0.2000 mg | ORAL_TABLET | Freq: Four times a day (QID) | ORAL | Status: AC
  Administered 2012-09-02 – 2012-09-03 (×6): 0.2 mg via ORAL
  Filled 2012-09-02 (×6): qty 1

## 2012-09-02 MED ORDER — OXYTOCIN 40 UNITS IN LACTATED RINGERS INFUSION - SIMPLE MED: 62.5000 mL/h | INTRAVENOUS | Status: DC

## 2012-09-02 MED ORDER — SIMETHICONE 80 MG PO CHEW: 80.0000 mg | CHEWABLE_TABLET | ORAL | Status: DC | PRN

## 2012-09-02 MED ORDER — LACTATED RINGERS IV SOLN
INTRAVENOUS | Status: DC
  Administered 2012-09-02: 02:00:00 via INTRAVENOUS

## 2012-09-02 MED ORDER — DIPHENHYDRAMINE HCL 25 MG PO CAPS: 25.0000 mg | ORAL_CAPSULE | Freq: Four times a day (QID) | ORAL | Status: DC | PRN

## 2012-09-02 MED ORDER — CITRIC ACID-SODIUM CITRATE 334-500 MG/5ML PO SOLN: 30.0000 mL | ORAL | Status: DC | PRN

## 2012-09-02 MED ORDER — SENNOSIDES-DOCUSATE SODIUM 8.6-50 MG PO TABS
2.0000 | ORAL_TABLET | Freq: Every day | ORAL | Status: DC
  Administered 2012-09-02 – 2012-09-03 (×2): 2 via ORAL

## 2012-09-02 MED ORDER — OXYCODONE-ACETAMINOPHEN 5-325 MG PO TABS: 1.0000 | ORAL_TABLET | ORAL | Status: DC | PRN

## 2012-09-02 MED ORDER — IBUPROFEN 600 MG PO TABS: 600.0000 mg | ORAL_TABLET | Freq: Four times a day (QID) | ORAL | Status: DC | PRN

## 2012-09-02 NOTE — Progress Notes (Signed)
Report called to [facility] in [facility]. Dr Clearance Coots called and report given and will come to 165.

## 2012-09-02 NOTE — H&P (Signed)
Kayla Baird is a 22 y.o. female presenting for UC's. Maternal Medical History:  Reason for admission: Contractions.  22 y o G3 P1.   EDC 08-27-12.  Presents with UC's.  Contractions: Onset was 6-12 hours ago.   Frequency: regular.   Perceived severity is strong.    Fetal activity: Perceived fetal activity is normal.   Last perceived fetal movement was within the past hour.    Prenatal complications: no prenatal complications Prenatal Complications - Diabetes: none.    OB History   Grav Para Term Preterm Abortions TAB SAB Ect Mult Living   2 1 1       1      Past Medical History  Diagnosis Date  . Yeast infection   . Chlamydia   . Medical history non-contributory    Past Surgical History  Procedure Laterality Date  . No past surgeries     Family History: family history includes Hypertension in her mother.  There is no history of Other. Social History:  reports that she quit smoking about 8 months ago. Her smoking use included Cigarettes. She smoked 0.25 packs per day. She does not have any smokeless tobacco history on file. She reports that she does not drink alcohol or use illicit drugs.   Prenatal Transfer Tool  Maternal Diabetes: No Genetic Screening: Normal Maternal Ultrasounds/Referrals: Normal Fetal Ultrasounds or other Referrals:  None Maternal Substance Abuse:  No Significant Maternal Medications:  Meds include: Other:  Tinidazole Significant Maternal Lab Results:  Lab values include: Group B Strep negative Other Comments:  None  Review of Systems  All other systems reviewed and are negative.    Dilation: 10 Station: 0 Exam by:: Quintella Baton RNC Blood pressure 117/77, pulse 88, height 5\' 6"  (1.676 m), weight 223 lb (101.152 kg), last menstrual period 11/21/2011. Maternal Exam:  Uterine Assessment: Contraction strength is firm.  Contraction frequency is regular.   Abdomen: Patient reports no abdominal tenderness. Fetal presentation: vertex  Introitus:  Normal vulva. Normal vagina.  Pelvis: adequate for delivery.   Cervix: Cervix evaluated by digital exam.     Physical Exam  Nursing note and vitals reviewed. Constitutional: She is oriented to person, place, and time. She appears well-developed and well-nourished.  HENT:  Head: Normocephalic and atraumatic.  Eyes: Conjunctivae are normal. Pupils are equal, round, and reactive to light.  Neck: Normal range of motion. Neck supple.  Cardiovascular: Normal rate and regular rhythm.   Respiratory: Effort normal and breath sounds normal.  GI: Soft.  Genitourinary: Vagina normal and uterus normal.  Musculoskeletal: Normal range of motion.  Neurological: She is alert and oriented to person, place, and time.  Skin: Skin is warm and dry.  Psychiatric: She has a normal mood and affect. Her behavior is normal. Judgment and thought content normal.    Prenatal labs: ABO, Rh: O/Positive/-- (11/08 0000) Antibody: Negative (11/08 0000) Rubella: Immune (11/08 0000) RPR: Nonreactive (11/08 0000)  HBsAg: Negative (11/08 0000)  HIV: Non-reactive (11/08 0000)  GBS: Negative (03/14 0000)   Assessment/Plan: 40.6 weeks.  Active labor.  Admit.   Zekiel Torian A 09/02/2012, 3:02 AM

## 2012-09-02 NOTE — MAU Note (Signed)
Pt directly back to Rm #5. Very uncomfortable.

## 2012-09-02 NOTE — Progress Notes (Signed)
Kayla Baird is a 22 y.o. G2P1001 at [redacted]w[redacted]d by LMP admitted for active labor  Subjective:   Objective: BP 119/76  Pulse 73  Temp(Src) 98.9 F (37.2 C) (Oral)  Resp 22  Ht 5\' 6"  (1.676 m)  Wt 223 lb (101.152 kg)  BMI 36.01 kg/m2  LMP 11/21/2011   Total I/O In: -  Out: 1000 [Blood:1000]  FHT:  FHR: 150 bpm, variability: moderate,  accelerations:  Present,  decelerations:  Absent UC:   regular, every 3 minutes SVE:   Dilation: 10 Station: 0 Exam by:: Quintella Baton RNC  Labs: Lab Results  Component Value Date   WBC 12.7* 09/02/2012   HGB 10.7* 09/02/2012   HCT 31.9* 09/02/2012   MCV 75.4* 09/02/2012   PLT 202 09/02/2012    Assessment / Plan: Spontaneous labor, progressing normally  Labor: Progressing normally Preeclampsia:  n/a Fetal Wellbeing:  Category I Pain Control:  Labor support without medications I/D:  n/a Anticipated MOD:  NSVD  HARPER,CHARLES A 09/02/2012, 3:17 AM

## 2012-09-03 NOTE — Progress Notes (Signed)
Post Partum Day 1 Subjective: no complaints  Objective: Blood pressure 116/85, pulse 90, temperature 98 F (36.7 C), temperature source Oral, resp. rate 16, height 5\' 6"  (1.676 m), weight 223 lb (101.152 kg), last menstrual period 11/21/2011, SpO2 100.00%, unknown if currently breastfeeding.  Physical Exam:  General: alert and no distress Lochia: appropriate Uterine Fundus: firm Incision: None DVT Evaluation: No evidence of DVT seen on physical exam.   Recent Labs  09/02/12 0215 09/02/12 0625  HGB 10.7* 9.7*  HCT 31.9* 29.1*    Assessment/Plan: Plan for discharge tomorrow   LOS: 1 day   Laekyn Rayos A 09/03/2012, 5:46 AM

## 2012-09-04 ENCOUNTER — Encounter: Payer: Self-pay | Admitting: Obstetrics

## 2012-09-04 MED ORDER — OXYCODONE-ACETAMINOPHEN 5-325 MG PO TABS: 1.0000 | ORAL_TABLET | ORAL | Status: DC | PRN

## 2012-09-04 MED ORDER — IBUPROFEN 600 MG PO TABS: 600.0000 mg | ORAL_TABLET | Freq: Four times a day (QID) | ORAL | Status: DC | PRN

## 2012-09-04 MED ORDER — FUSION PLUS PO CAPS: 1.0000 | ORAL_CAPSULE | Freq: Every day | ORAL | Status: DC

## 2012-09-04 NOTE — Progress Notes (Signed)
UR chart review completed.  

## 2012-09-04 NOTE — Progress Notes (Signed)
Post Partum Day 2 Subjective: tolerating PO  Objective: Blood pressure 113/70, pulse 87, temperature 97.7 F (36.5 C), temperature source Oral, resp. rate 18, height 5\' 6"  (1.676 m), weight 223 lb (101.152 kg), last menstrual period 11/21/2011, SpO2 100.00%, unknown if currently breastfeeding.  Physical Exam:  General: alert and no distress Lochia: appropriate Uterine Fundus: firm Incision: None DVT Evaluation: No evidence of DVT seen on physical exam.   Recent Labs  09/02/12 0215 09/02/12 0625  HGB 10.7* 9.7*  HCT 31.9* 29.1*    Assessment/Plan: Discharge home   LOS: 2 days   Starkisha Tullis A 09/04/2012, 8:10 AM

## 2012-09-06 NOTE — Discharge Summary (Signed)
Obstetric Discharge Summary Reason for Admission: onset of labor Prenatal Procedures: ultrasound Intrapartum Procedures: spontaneous vaginal delivery Postpartum Procedures: none Complications-Operative and Postpartum: none Hemoglobin  Date Value Range Status  09/02/2012 9.7* 12.0 - 15.0 g/dL Final     HCT  Date Value Range Status  09/02/2012 29.1* 36.0 - 46.0 % Final    Physical Exam:  General: alert and no distress Lochia: appropriate Uterine Fundus: firm Incision: healing well DVT Evaluation: No evidence of DVT seen on physical exam.  Discharge Diagnoses: Term Pregnancy-delivered  Discharge Information: Date: 09/06/2012 Activity: pelvic rest Diet: routine Medications: PNV and Ibuprofen, Percocet and iron. Condition: stable Instructions: refer to practice specific booklet Discharge to: home Follow-up Information   Follow up with HARPER,CHARLES A, MD. Schedule an appointment as soon as possible for a visit in 6 weeks.   Contact information:   [address] [place] [address] [phone]       Newborn Data: Live born female  Birth Weight: 6 lb 2.8 oz (2800 g) APGAR: 8, 9  Home with mother.  HARPER,CHARLES A 09/06/2012, 8:56 AM

## 2012-10-10 ENCOUNTER — Ambulatory Visit: Payer: Medicaid Other | Admitting: Obstetrics

## 2013-01-30 ENCOUNTER — Encounter (HOSPITAL_COMMUNITY): Payer: Self-pay | Admitting: Emergency Medicine

## 2013-01-30 ENCOUNTER — Emergency Department (HOSPITAL_COMMUNITY)
Admission: EM | Admit: 2013-01-30 | Discharge: 2013-01-30 | Disposition: A | Discharge status: Home / Self Care | Payer: Medicaid Other | Attending: Emergency Medicine | Admitting: Emergency Medicine

## 2013-01-30 DIAGNOSIS — Z79899 Other long term (current) drug therapy: Secondary | ICD-10-CM | POA: Insufficient documentation

## 2013-01-30 DIAGNOSIS — Z8619 Personal history of other infectious and parasitic diseases: Secondary | ICD-10-CM | POA: Insufficient documentation

## 2013-01-30 DIAGNOSIS — Z87891 Personal history of nicotine dependence: Secondary | ICD-10-CM | POA: Insufficient documentation

## 2013-01-30 DIAGNOSIS — R21 Rash and other nonspecific skin eruption: Secondary | ICD-10-CM | POA: Insufficient documentation

## 2013-01-30 DIAGNOSIS — IMO0002 Reserved for concepts with insufficient information to code with codable children: Secondary | ICD-10-CM | POA: Insufficient documentation

## 2013-01-30 MED ORDER — HYDROCORTISONE 1 % EX CREA: TOPICAL_CREAM | Freq: Two times a day (BID) | CUTANEOUS | Status: DC

## 2013-01-30 MED ORDER — PREDNISONE 20 MG PO TABS: 40.0000 mg | ORAL_TABLET | Freq: Every day | ORAL | Status: DC

## 2013-01-30 NOTE — ED Provider Notes (Signed)
  CSN: [id number]     Arrival date & time 01/30/13  1023 History     First MD Initiated Contact with Patient 01/30/13 1052     Chief Complaint  Patient presents with  . Rash   (Consider location/radiation/quality/duration/timing/severity/associated sxs/prior Treatment) HPI Comments: Patient presents emergency department with chief complaint scattered with dry, scaly rash to the upper and lower extremities and torso. She states that she noticed the rash about 3 weeks ago. He states that have been slowly worsening. She states that the lesions do not itch or hurt. She has not tried anything to alleviate her symptoms. She denies fevers, chills, myalgias, arthralgias, inserts osteophytes, or any new soaps, detergents, or new medications.  The history is provided by the patient. No language interpreter was used.    Past Medical History  Diagnosis Date  . Yeast infection   . Chlamydia   . Medical history non-contributory    Past Surgical History  Procedure Laterality Date  . No past surgeries     Family History  Problem Relation Age of Onset  . Other Neg Hx   . Hypertension Mother    History  Substance Use Topics  . Smoking status: Former Smoker -- 0.25 packs/day    Types: Cigarettes    Quit date: 12/29/2011  . Smokeless tobacco: Not on file  . Alcohol Use: No   OB History   Grav Para Term Preterm Abortions TAB SAB Ect Mult Living   2 2 2       2      Review of Systems  All other systems reviewed and are negative.    Allergies  Review of patient's allergies indicates no known allergies.  Home Medications   Current Outpatient Rx  Name  Route  Sig  Dispense  Refill  . Prenatal Vit-Fe Fumarate-FA (PRENATAL MULTIVITAMIN) TABS   Oral   Take 1 tablet by mouth daily at 12 noon.         . hydrocortisone cream 1 %   Topical   Apply topically 2 (two) times daily. Do not apply to face   15 g   1   . predniSONE (DELTASONE) 20 MG tablet   Oral   Take 2 tablets (40 mg  total) by mouth daily.   10 tablet   0    BP 124/93  Pulse 80  Temp(Src) 98.8 F (37.1 C) (Oral)  SpO2 100% Physical Exam  Nursing note and vitals reviewed. Constitutional: She is oriented to person, place, and time. She appears well-developed and well-nourished.  HENT:  Head: Normocephalic and atraumatic.  Eyes: Conjunctivae and EOM are normal.  Neck: Normal range of motion.  Cardiovascular: Normal rate.   Pulmonary/Chest: Effort normal.  Abdominal: She exhibits no distension.  Musculoskeletal: Normal range of motion.  Neurological: She is alert and oriented to person, place, and time.  Skin: Skin is dry. Rash noted.  Scattered dry scaly rash to the upper and lower extremities torso  Psychiatric: She has a normal mood and affect. Her behavior is normal. Judgment and thought content normal.    ED Course   Procedures (including critical care time)  Labs Reviewed - No data to display No results found. 1. Rash     MDM  Patient with rash, no fevers, chills, nausea, or vomiting. No signs of sjs.  Will treat with prednisone and hydrocortisone. Return precautions given. Patient is stable and ready for discharge.  Roxy Horseman, PA-C 01/30/13 1130

## 2013-01-30 NOTE — ED Notes (Signed)
Pt sts she is having a rash on her back, legs and bilateral arms. Pt has never had this in the past and unsure what the cause is. No airway distress noted.

## 2013-01-31 NOTE — ED Provider Notes (Signed)
Medical screening examination/treatment/procedure(s) were performed by non-physician practitioner and as supervising physician I was immediately available for consultation/collaboration.   Lyanne Co, MD 01/31/13 [phone]

## 2013-02-15 ENCOUNTER — Encounter (HOSPITAL_COMMUNITY): Payer: Self-pay | Admitting: *Deleted

## 2013-02-15 ENCOUNTER — Inpatient Hospital Stay (HOSPITAL_COMMUNITY)
Admission: AD | Admit: 2013-02-15 | Discharge: 2013-02-15 | Disposition: A | Discharge status: Home / Self Care | Payer: Medicaid Other | Source: Ambulatory Visit | Attending: Obstetrics & Gynecology | Admitting: Obstetrics & Gynecology

## 2013-02-15 DIAGNOSIS — B356 Tinea cruris: Secondary | ICD-10-CM | POA: Insufficient documentation

## 2013-02-15 DIAGNOSIS — N949 Unspecified condition associated with female genital organs and menstrual cycle: Secondary | ICD-10-CM | POA: Insufficient documentation

## 2013-02-15 LAB — URINALYSIS, ROUTINE W REFLEX MICROSCOPIC
Bilirubin Urine: NEGATIVE
Glucose, UA: NEGATIVE mg/dL
Hgb urine dipstick: NEGATIVE
Specific Gravity, Urine: 1.025 (ref 1.005–1.030)
Urobilinogen, UA: 1 mg/dL (ref 0.0–1.0)
pH: 6.5 (ref 5.0–8.0)

## 2013-02-15 LAB — URINE MICROSCOPIC-ADD ON

## 2013-02-15 LAB — POCT PREGNANCY, URINE: Preg Test, Ur: NEGATIVE

## 2013-02-15 MED ORDER — TERBINAFINE HCL 1 % EX CREA: TOPICAL_CREAM | Freq: Two times a day (BID) | CUTANEOUS | Status: DC

## 2013-02-15 NOTE — MAU Provider Note (Signed)
History     CSN: [id number]  Arrival date and time: 02/15/13 1002   First Provider Initiated Contact with Patient 02/15/13 1111      Chief Complaint  Patient presents with  . Rash  . Vaginal Discharge   Rash Pertinent negatives include no diarrhea, fever, shortness of breath or vomiting.  Vaginal Discharge The patient's primary symptoms include a vaginal discharge. Associated symptoms include dysuria and rash. Pertinent negatives include no abdominal pain, chills, constipation, diarrhea, fever, flank pain, frequency, headaches, hematuria, nausea, urgency or vomiting.   Kayla Baird is a 22 y.o. [phone] presents for evaluation of the rash. Present for 2-3 weeks. Occasionally itchy. Started on right leg, rings, then spread to bilateral and forearms. A few lesions on right flank. Otherwise only complaint is dysuria. No inc freq, no pressure.    OB History   Grav Para Term Preterm Abortions TAB SAB Ect Mult Living   2 2 2       2       Past Medical History  Diagnosis Date  . Yeast infection   . Chlamydia   . Medical history non-contributory     Past Surgical History  Procedure Laterality Date  . No past surgeries    . Eye surgery      Family History  Problem Relation Age of Onset  . Other Neg Hx   . Hypertension Mother     History  Substance Use Topics  . Smoking status: Current Every Day Smoker    Types: Cigarettes    Last Attempt to Quit: 12/29/2011  . Smokeless tobacco: Not on file  . Alcohol Use: Yes     Comment: social alcohol    Allergies: No Known Allergies  Prescriptions prior to admission  Medication Sig Dispense Refill  . Prenatal Vit-Fe Fumarate-FA (PRENATAL MULTIVITAMIN) TABS Take 1 tablet by mouth daily at 12 noon.        Review of Systems  Constitutional: Negative for fever and chills.  Respiratory: Negative for sputum production and shortness of breath.   Cardiovascular: Negative for chest pain, palpitations and orthopnea.    Gastrointestinal: Negative for heartburn, nausea, vomiting, abdominal pain, diarrhea and constipation.  Genitourinary: Positive for dysuria and vaginal discharge. Negative for urgency, frequency, hematuria and flank pain.  Skin: Positive for itching and rash.  Neurological: Negative for headaches.   Physical Exam   Blood pressure 122/67, pulse 71, temperature 0 F (-17.8 C), resp. rate 18, height 5\' 5"  (1.651 m), weight 94.62 kg (208 lb 9.6 oz), last menstrual period 02/03/2013.  Physical Exam  Nursing note and vitals reviewed. Constitutional: She appears well-developed and well-nourished. No distress.  GI: Soft. Bowel sounds are normal. She exhibits no distension and no mass. There is no tenderness. There is no rebound and no guarding.  Skin: Skin is dry. Lesion and rash (dry scaly plaues on legs and bilateral arm. ) noted. She is not diaphoretic.      Results for TOMMY, MINICHIELLO (MRN [id number]) as of 02/15/2013 11:24  Ref. Range 02/15/2013 10:10  Color, Urine Latest Range: YELLOW  YELLOW  APPearance Latest Range: CLEAR  CLEAR  Specific Gravity, Urine Latest Range: 1.005-1.030  1.025  pH Latest Range: 5.0-8.0  6.5  Glucose Latest Range: NEGATIVE mg/dL NEGATIVE  Bilirubin Urine Latest Range: NEGATIVE  NEGATIVE  Ketones, ur Latest Range: NEGATIVE mg/dL NEGATIVE  Protein Latest Range: NEGATIVE mg/dL NEGATIVE  Urobilinogen, UA Latest Range: 0.0-1.0 mg/dL 1.0  Nitrite Latest Range: NEGATIVE  NEGATIVE  Leukocytes,  UA Latest Range: NEGATIVE  TRACE (A)  Hgb urine dipstick Latest Range: NEGATIVE  NEGATIVE  WBC, UA Latest Range: <3 WBC/hpf 3-6  Squamous Epithelial / LPF Latest Range: RARE  RARE  Bacteria, UA Latest Range: RARE  FEW (A)     MAU Course  Procedures  Assessment and Plan  22 y.o. F presents for evaluation of rash. Most consistent with tinea cruris. Will start pt on terbinafine. Pt to f/u w/ PCM in 2-3 weeks if no improvement. May consider systemic tx at that time. Pt  states understanding. Pt reports son with similar and recommend he be evaluated by his pediatrician.   Tawana Scale, MD OB Fellow   Worth Kober RYAN 02/15/2013, 11:20 AM

## 2013-02-15 NOTE — MAU Note (Signed)
Patient states she has a rash on the right side of her abdomen and arm. States she thinks she might have a UTI because she has a discharge and slight discomfort with urination.

## 2013-02-16 LAB — URINE CULTURE: Colony Count: 8000

## 2013-03-01 ENCOUNTER — Emergency Department (HOSPITAL_COMMUNITY)
Admission: EM | Admit: 2013-03-01 | Discharge: 2013-03-01 | Disposition: A | Discharge status: Home / Self Care | Payer: Medicaid Other | Source: Home / Self Care

## 2013-03-01 ENCOUNTER — Encounter (HOSPITAL_COMMUNITY): Payer: Self-pay | Admitting: Emergency Medicine

## 2013-03-01 DIAGNOSIS — B354 Tinea corporis: Secondary | ICD-10-CM

## 2013-03-01 MED ORDER — FLUCONAZOLE 100 MG PO TABS: 100.0000 mg | ORAL_TABLET | Freq: Every day | ORAL | Status: DC

## 2013-03-01 NOTE — ED Provider Notes (Signed)
CSN: [id number]     Arrival date & time 03/01/13  1430 History   First MD Initiated Contact with Patient 03/01/13 1515     Chief Complaint  Patient presents with  . Rash   (Consider location/radiation/quality/duration/timing/severity/associated sxs/prior Treatment) HPI Comments: 22 year old female with a history of tenia corporis presents to the urgent care after presenting to the emergency department for a rash typical of tenia corporis. She has been using OTC Lamisil but feels like it is not completely curing her lesions. She demonstrates typical ringworm type lesions to the thighs back waist lower extremities and trunk. Mild pruritus. Denies systemic symptoms.   Past Medical History  Diagnosis Date  . Yeast infection   . Chlamydia   . Medical history non-contributory    Past Surgical History  Procedure Laterality Date  . No past surgeries    . Eye surgery     Family History  Problem Relation Age of Onset  . Other Neg Hx   . Hypertension Mother    History  Substance Use Topics  . Smoking status: Current Every Day Smoker    Types: Cigarettes    Last Attempt to Quit: 12/29/2011  . Smokeless tobacco: Not on file  . Alcohol Use: Yes     Comment: social alcohol   OB History   Grav Para Term Preterm Abortions TAB SAB Ect Mult Living   2 2 2       2      Review of Systems  Constitutional: Negative.   Respiratory: Negative.   Gastrointestinal: Negative.   Genitourinary: Negative.   Skin:       As per history of present illness  Neurological: Negative.   All other systems reviewed and are negative.    Allergies  Review of patient's allergies indicates no known allergies.  Home Medications   Current Outpatient Rx  Name  Route  Sig  Dispense  Refill  . terbinafine (LAMISIL) 1 % cream   Topical   Apply topically 2 (two) times daily.   42 g   2   . fluconazole (DIFLUCAN) 100 MG tablet   Oral   Take 1 tablet (100 mg total) by mouth daily.   10 tablet   0   .  Prenatal Vit-Fe Fumarate-FA (PRENATAL MULTIVITAMIN) TABS   Oral   Take 1 tablet by mouth daily at 12 noon.          BP 118/78  Pulse 79  Temp(Src) 97.5 F (36.4 C) (Oral)  Resp 15  SpO2 100%  LMP 02/03/2013  Breastfeeding? No Physical Exam  Nursing note and vitals reviewed. Constitutional: She is oriented to person, place, and time. She appears well-developed and well-nourished. No distress.  Eyes: Conjunctivae and EOM are normal.  Neck: Normal range of motion. Neck supple.  Cardiovascular: Normal rate.   Pulmonary/Chest: Effort normal. No respiratory distress.  Musculoskeletal: She exhibits no edema.  Neurological: She is alert and oriented to person, place, and time.  Skin: Skin is warm and dry. Rash noted.  Annular and unemployed scaly lesions isolated and some confluent to primarily eyes, waist line, buttock and portion of the torso.  Psychiatric: She has a normal mood and affect.    ED Course  Procedures (including critical care time) Labs Review Labs Reviewed - No data to display Imaging Review No results found.  MDM   1. Tinea corporis      Patient is not pregnant. And she is not breast-feeding her newborn. Diflucan 100 mg daily for  10 days Continue to use the Lamisil cream twice a day on a daily basis Obtain a PCP as soon as possible.  Hayden Rasmussen, NP 03/01/13 1535

## 2013-03-01 NOTE — ED Notes (Signed)
Pt c/o rash onset 3 weeks on arms, legs and back States she was seen at [facility] about 2 weeks and given a fungal med States he has helped a little bit but not much Denies: itching and fevers Alert w/no signs of acute distress.

## 2013-03-01 NOTE — ED Provider Notes (Signed)
Medical screening examination/treatment/procedure(s) were performed by non-physician practitioner and as supervising physician I was immediately available for consultation/collaboration.  Leslee Home, M.D.  Reuben Likes, MD 03/01/13 [phone]

## 2013-07-02 ENCOUNTER — Inpatient Hospital Stay (HOSPITAL_COMMUNITY)
Admission: AD | Admit: 2013-07-02 | Discharge: 2013-07-02 | Disposition: A | Discharge status: Home / Self Care | Payer: Medicaid Other | Source: Ambulatory Visit | Attending: Family Medicine | Admitting: Family Medicine

## 2013-07-02 ENCOUNTER — Encounter (HOSPITAL_COMMUNITY): Payer: Self-pay | Admitting: *Deleted

## 2013-07-02 DIAGNOSIS — Z3201 Encounter for pregnancy test, result positive: Secondary | ICD-10-CM | POA: Insufficient documentation

## 2013-07-02 LAB — URINALYSIS, ROUTINE W REFLEX MICROSCOPIC
BILIRUBIN URINE: NEGATIVE
GLUCOSE, UA: NEGATIVE mg/dL
Hgb urine dipstick: NEGATIVE
KETONES UR: NEGATIVE mg/dL
LEUKOCYTES UA: NEGATIVE
NITRITE: NEGATIVE
PROTEIN: NEGATIVE mg/dL
Specific Gravity, Urine: 1.025 (ref 1.005–1.030)
Urobilinogen, UA: 0.2 mg/dL (ref 0.0–1.0)
pH: 7 (ref 5.0–8.0)

## 2013-07-02 LAB — POCT PREGNANCY, URINE: PREG TEST UR: POSITIVE — AB

## 2013-07-02 MED ORDER — ONDANSETRON HCL 4 MG PO TABS: 4.0000 mg | ORAL_TABLET | Freq: Three times a day (TID) | ORAL | Status: DC | PRN

## 2013-07-02 MED ORDER — PROMETHAZINE HCL 25 MG PO TABS: 25.0000 mg | ORAL_TABLET | Freq: Four times a day (QID) | ORAL | Status: DC | PRN

## 2013-07-02 NOTE — Discharge Instructions (Signed)
Prenatal Care  °WHAT IS PRENATAL CARE?  °Prenatal care means health care during your pregnancy, before your baby is born. It is very important to take care of yourself and your baby during your pregnancy by:  °· Getting early prenatal care. If you know you are pregnant, or think you might be pregnant, call your health care provider as soon as possible. Schedule a visit for a prenatal exam. °· Getting regular prenatal care. Follow your health care provider's schedule for blood and other necessary tests. Do not miss appointments. °· Doing everything you can to keep yourself and your baby healthy during your pregnancy. °· Getting complete care. Prenatal care should include evaluation of the medical, dietary, educational, psychological, and social needs of you and your significant other. The medical and genetic history of your family and the family of your baby's father should be discussed with your health care provider. °· Discussing with your health care provider: °· Prescription, over-the-counter, and herbal medicines that you take. °· Any history of substance abuse, alcohol use, smoking, and illegal drug use. °· Any history of domestic abuse and violence. °· Immunizations you have received. °· Your nutrition and diet. °· The amount of exercise you do. °· Any environmental and occupational hazards to which you are exposed. °· History of sexually transmitted infections for both you and your partner. °· Previous pregnancies you have had. °WHY IS PRENATAL CARE SO IMPORTANT?  °By regularly seeing your health care provider, you help ensure that problems can be identified early so that they can be treated as soon as possible. Other problems might be prevented. Many studies have shown that early and regular prenatal care is important for the health of mothers and their babies.  °HOW CAN I TAKE CARE OF MYSELF WHILE I AM PREGNANT?  °Here are ways to take care of yourself and your baby:  °· Start or continue taking your  multivitamin with 400 micrograms (mcg) of folic acid every day. °· Get early and regular prenatal care. It is very important to see a health care provider during your pregnancy. Your health care provider will check at each visit to make sure that you and the baby are healthy. If there are any problems, action can be taken right away to help you and the baby. °· Eat a healthy diet that includes: °· Fruits. °· Vegetables. °· Foods low in saturated fat. °· Whole grains. °· Calcium-rich foods, such as milk, yogurt, and hard cheeses. °· Drink 6 to 8 glasses of liquids a day. °· Unless your health care provider tells you not to, try to be physically active for 30 minutes, most days of the week. If you are pressed for time, you can get your activity in through 10-minute segments, three times a day. °· Do not smoke, drink alcohol, or use drugs. These can cause long-term damage to your baby. Talk with your health care provider about steps to take to stop smoking. Talk with a member of your faith community, a counselor, a trusted friend, or your health care provider if you are concerned about your alcohol or drug use. °· Ask your health care provider before taking any medicine, even over-the-counter medicines. Some medicines are not safe to take during pregnancy. °· Get plenty of rest and sleep. °· Avoid hot tubs and saunas during pregnancy. °· Do not have X-rays taken unless absolutely necessary and with the recommendation of your health care provider. A lead shield can be placed on your abdomen to protect the   baby when X-rays are taken in other parts of the body. °· Do not empty the cat litter when you are pregnant. It may contain a parasite that causes an infection called toxoplasmosis, which can cause birth defects. Also, use gloves when working in garden areas used by cats. °· Do not eat uncooked or undercooked meats or fish. °· Do not eat soft, mold-ripened cheeses (Brie, Camembert, and chevre) or soft, blue-veined  cheese (Danish blue and Roquefort). °· Stay away from toxic chemicals like: °· Insecticides. °· Solvents (some cleaners or paint thinners). °· Lead. °· Mercury. °· Sexual intercourse may continue until the end of the pregnancy, unless you have a medical problem or there is a problem with the pregnancy and your health care provider tells you not to. °· Do not wear high-heel shoes, especially during the second half of the pregnancy. You can lose your balance and fall. °· Do not take long trips, unless absolutely necessary. Be sure to see your health care provider before going on the trip. °· Do not sit in one position for more than 2 hours when on a trip. °· Take a copy of your medical records when going on a trip. Know where a hospital is located in the city you are visiting, in case of an emergency. °· Most dangerous household products will have pregnancy warnings on their labels. Ask your health care provider about products if you are unsure. °· Limit or eliminate your caffeine intake from coffee, tea, sodas, medicines, and chocolate. °· Many women continue working through pregnancy. Staying active might help you stay healthier. If you have a question about the safety or the hours you work at your particular job, talk with your health care provider. °· Get informed: °· Read books. °· Watch videos. °· Go to childbirth classes for you and your significant other. °· Talk with experienced moms. °· Ask your health care provider about childbirth education classes for you and your partner. Classes can help you and your partner prepare for the birth of your baby. °· Ask about a baby doctor (pediatrician) and methods and pain medicine for labor, delivery, and possible cesarean delivery. °HOW OFTEN SHOULD I SEE MY HEALTH CARE PROVIDER DURING PREGNANCY?  °Your health care provider will give you a schedule for your prenatal visits. You will have visits more often as you get closer to the end of your pregnancy. An average  pregnancy lasts about 40 weeks.  °A typical schedule includes visiting your health care provider:  °· About once each month during your first 6 months of pregnancy. °· Every 2 weeks during the next 2 months. °· Weekly in the last month, until the delivery date. °Your health care provider will probably want to see you more often if: °· You are older than 35 years. °· Your pregnancy is high risk because you have certain health problems or problems with the pregnancy, such as: °· Diabetes. °· High blood pressure. °· The baby is not growing on schedule, according to the dates of the pregnancy. °Your health care provider will do special tests to make sure you and the baby are not having any serious problems. °WHAT HAPPENS DURING PRENATAL VISITS?  °· At your first prenatal visit, your health care provider will do a physical exam and talk to you about your health history and the health history of your partner and your family. Your health care provider will be able to tell you what date to expect your baby to be born on. °·   Your first physical exam will include checks of your blood pressure, measurements of your height and weight, and an exam of your pelvic organs. Your health care provider will do a Pap test if you have not had one recently and will do cultures of your cervix to make sure there is no infection. °· At each prenatal visit, there will be tests of your blood, urine, blood pressure, weight, and checking the progress of the baby. °· At your later prenatal visits, your health care provider will check how you are doing and how the baby is developing. You may have a number of tests done as your pregnancy progresses. °· Ultrasound exams are often used to check on the baby's growth and health. °· You may have more urine and blood tests, as well as special tests, if needed. These may include amniocentesis to examine fluid in the pregnancy sac, stress tests to check how the baby responds to contractions, or a  biophysical profile to measure fetus well-being. Your health care provider will explain the tests and why they are necessary. °· You should discuss with your health care provider your plans to breastfeed or bottle-feed your baby. °· Each visit is also a chance for you to learn about staying healthy during pregnancy and to ask questions. °Document Released: 06/03/2003 Document Revised: 03/21/2013 Document Reviewed: 11/16/2012 °ExitCare® Patient Information ©2014 [employer]. ° °Pregnancy, The Father's Role °A father has an important role during their partners pregnancy, labor, delivery and afterward. It is important to help and support your partner through this new period. There are many physical and emotional changes that happen. To be helpful and supportive during this time, you should know and understand what is happening to your partner during the pregnancy, labor, delivery and postpartum period.  °PREGNANCY °Pregnancy lasts 40 weeks (plus or minus 2 weeks). The pregnancy is divided into three trimesters.  °· In the first 13 weeks, the mother feels tired, has painful breasts, may feel sick to her stomach (nauseated), throw up (vomit), urinates more often and may have mood changes. All of these changes are normal. If the father is aware of these, he can be more helpful, supportive and understanding. This may include helping with household duties and activities and spending more time with each other. °· In the next 14 to 28 weeks, your partner is over the tiredness, nausea and vomiting. She will likely feel better and more energetic. This is the best time of the pregnancy to be more active together, go out more often or take trips. You will be able to see her belly popping out with the pregnancy. You may be able to feel the baby kick. °· In the last 12 weeks, she may become more uncomfortable again because her abdomen is popping out more as the baby grows. She may have a hard time doing household chores, her  balance may be off, she may have a hard time bending over, tires easily and has a tough time sleeping. At this time, you will realize the birth of your baby is close. You and your partner may have concerns about the safety of your partner and if the baby will be normal and healthy. These are all normal and natural feelings. You should talk with each other and your caregiver if you have any questions. °Attend prenatal care visits with your partner. This is a good time for you to get to know your caregiver, follow the pregnancy and ask questions. Prenatal visits are once a month   for 6 months, then they are every 2 weeks for 2 months and then once a week the last month. You may have more prenatal visits if your caregiver feels it is needed. Your caregiver usually does an ultrasound of the baby at one of the prenatal visits or more often if needed. It is an exciting and emotional to see the baby moving and the heart beating.  °Fathers can experience emotional changes during this time as well. These emotions can include happiness, excitement and feeling proud. Fathers may also be concerned about having new responsibilities. These include financial, educational and if it will change the relationship with his partner. These feelings are normal. They should be talked about openly and positively with each other. °An important and often asked question is if sexual intercourse is safe during pregnancy and if it will harm the baby. Sexual intercourse is safe unless there is a problem with the pregnancy and your caregiver advises you to not have sexual intercourse. Because physical and emotional changes happen in pregnancy, your partner may not want to have sex during certain times. This is mostly true in the first and third trimesters. Trying different positions may make sexual intercourse comfortable. It is important for the both of you to discuss your feelings and desires with this problem. Talk to your caregiver about any  questions you have about sexual intercourse during the pregnancy. °LABOR AND DELIVERY °There are childbirth classes available for couples to take together. They help you understand what happens during labor and delivery. They also teach you how to help your partner with her labor pains, how to relax, breath properly during a contraction and focus on what is happening during labor. You may be asked to time the contractions, massage her back and breath with her during the contractions. You are also there to see and enjoy the excitement your baby being born. If you have any feelings of fainting or are uncomfortable, tell someone to help you. You may be asked to leave the room if a problem develops during the labor or delivery. °Sometimes a Cesarean Section (C-section) is scheduled or is an emergency during labor and delivery. A C-section is a major operation to deliver the baby. It is done through an incision in the abdomen and uterus. Your partner will be given a medicine to make her sleep (general anesthesia) or spinal anesthesia (numbing the body from the waist down). Most hospitals allow the father in the room for a C-section unless it is an emergency. Recovery from a C-section takes longer, is more uncomfortable and will require more help from the father. °AFTER DELIVERY °After the baby is born, the mother goes through many changes again. These changes could last 4 to 6 weeks or longer following a C-section. It is not unusual to be anxious, concerned and afraid that you may not be taking care of your newborn baby properly. Your partner may take a while to regain her strength. She may also get feelings of sadness (postpartum blues or depression), which is a more serious condition that may require medical treatment.  °Your partner may decide to breastfeed the baby. This helps with bonding between the mother and the baby. Breastfeeding is the best way to feed the baby, but you may feel "left out." However, you can  feel included by burping the baby and bottle feeding the baby with breast milk (collected by the mother) to give your partner some rest. This also helps you to bond with the baby. Breastfeeding   mothers can get pregnant even if they are not having menstrual periods. Therefore, some form of birth control should be used if you do not want to get pregnant. °Another question and concern is when it is safe to have sexual intercourse again. Usually it takes 4 to 6 weeks for healing to be over with. It may take longer after a C-section. If you have any questions about having sexual intercourse or if it is painful, talk to your caregiver. °As a father, you will be adjusting your role as the baby grows. Fatherhood is a on-going learning experience. You and your partner should still make time to be together alone and be the couple you were before the baby was born. This is helpful for you, your partner and your baby. As you can see, it is important for a father to be helpful, understanding and supportive during this special time. °Document Released: 11/17/2007 Document Revised: 08/23/2011 Document Reviewed: 11/17/2007 °ExitCare® Patient Information ©2014 [employer]. ° °

## 2013-07-02 NOTE — MAU Note (Signed)
Patient states she has had a positive home pregnancy test. Wants confirmation and to know how far she is. Has slight nausea with vomiting x 1 today. Denies pain, bleeding or discharge.

## 2013-07-02 NOTE — MAU Provider Note (Signed)
HPI:  Kayla Baird is a 23 y.o. female G3P2002 at 2979w4d who presents to MAU for a verification letter.  Pt denies vaginal bleeding or pain at this time. Pt has been having some mild nausea, and vomited one time yesterday. Pt is unsure whether she wants to keep this pregnancy; if she does she plans to start care with [facility].     Objective:    GENERAL: Well-developed, well-nourished female in no acute distress.  HEENT: Normocephalic, atraumatic.   LUNGS: Effort normal HEART: Regular rate  SKIN: Warm, dry and without erythema PSYCH: Normal mood and affect  Filed Vitals:   07/02/13 1751  BP: 117/58  Pulse: 95  Temp: 98.9 F (37.2 C)  Resp: 16    Results for orders placed during the hospital encounter of 07/02/13 (from the past 48 hour(s))  URINALYSIS, ROUTINE W REFLEX MICROSCOPIC     Status: None   Collection Time    07/02/13  5:55 PM      Result Value Range   Color, Urine YELLOW  YELLOW   APPearance CLEAR  CLEAR   Specific Gravity, Urine 1.025  1.005 - 1.030   pH 7.0  5.0 - 8.0   Glucose, UA NEGATIVE  NEGATIVE mg/dL   Hgb urine dipstick NEGATIVE  NEGATIVE   Bilirubin Urine NEGATIVE  NEGATIVE   Ketones, ur NEGATIVE  NEGATIVE mg/dL   Protein, ur NEGATIVE  NEGATIVE mg/dL   Urobilinogen, UA 0.2  0.0 - 1.0 mg/dL   Nitrite NEGATIVE  NEGATIVE   Leukocytes, UA NEGATIVE  NEGATIVE   Comment: MICROSCOPIC NOT DONE ON URINES WITH NEGATIVE PROTEIN, BLOOD, LEUKOCYTES, NITRITE, OR GLUCOSE <1000 mg/dL.  POCT PREGNANCY, URINE     Status: Abnormal   Collection Time    07/02/13  6:12 PM      Result Value Range   Preg Test, Ur POSITIVE (*) NEGATIVE   Comment:            THE SENSITIVITY OF THIS     METHODOLOGY IS >24 mIU/mL     A:  1. Encounter for pregnancy test, result positive    P:  Discharge home in stable condition  RX: Zofran         Phenergan  Start prenatal care ASAP Return to MAU as needed, if symptoms worsen First trimester warning signs discussed   Iona HansenJennifer  Irene Rasch, NP 07/02/2013 6:42 PM

## 2013-07-03 NOTE — MAU Provider Note (Signed)
Attestation of Attending Supervision of Advanced Practitioner (PA/CNM/NP): Evaluation and management procedures were performed by the Advanced Practitioner under my supervision and collaboration.  I have reviewed the Advanced Practitioner's note and chart, and I agree with the management and plan.  Cosima Prentiss S, MD [facility] Attending 07/03/2013 6:22 AM

## 2014-04-15 ENCOUNTER — Encounter (HOSPITAL_COMMUNITY): Payer: Self-pay | Admitting: *Deleted

## 2014-05-07 ENCOUNTER — Encounter (HOSPITAL_COMMUNITY): Payer: Self-pay | Admitting: *Deleted

## 2014-11-08 ENCOUNTER — Emergency Department (HOSPITAL_COMMUNITY)
Admission: EM | Admit: 2014-11-08 | Discharge: 2014-11-09 | Disposition: A | Discharge status: Home / Self Care | Payer: Medicaid Other | Attending: Emergency Medicine | Admitting: Emergency Medicine

## 2014-11-08 ENCOUNTER — Encounter (HOSPITAL_COMMUNITY): Payer: Self-pay | Admitting: *Deleted

## 2014-11-08 DIAGNOSIS — Z79899 Other long term (current) drug therapy: Secondary | ICD-10-CM | POA: Insufficient documentation

## 2014-11-08 DIAGNOSIS — L02412 Cutaneous abscess of left axilla: Secondary | ICD-10-CM | POA: Insufficient documentation

## 2014-11-08 DIAGNOSIS — Z720 Tobacco use: Secondary | ICD-10-CM | POA: Insufficient documentation

## 2014-11-08 DIAGNOSIS — Z8619 Personal history of other infectious and parasitic diseases: Secondary | ICD-10-CM | POA: Insufficient documentation

## 2014-11-08 MED ORDER — LIDOCAINE HCL (PF) 1 % IJ SOLN
5.0000 mL | Freq: Once | INTRAMUSCULAR | Status: AC
  Administered 2014-11-09: 5 mL
  Filled 2014-11-08: qty 5

## 2014-11-08 NOTE — ED Notes (Signed)
Pt. reports abscess at left axilla onset this week with no drainage , denies fever or chills.

## 2014-11-08 NOTE — ED Notes (Signed)
Patient presents with "cyst" under left arm  Has history of abcess

## 2014-11-08 NOTE — ED Provider Notes (Signed)
CSN: [id number]     Arrival date & time 11/08/14  2150 History  This chart was scribed for non-physician practitioner Felicie Morn, NP working with April Palumbo, MD,by Lyndel Safe, ED Scribe. This patient was seen in room TR05C/TR05C and the patient's care was started at 11:34 PM.  Chief Complaint  Patient presents with  . Abscess   Patient is a 24 y.o. female presenting with abscess. The history is provided by the patient. No language interpreter was used.  Abscess Location:  Shoulder/arm Shoulder/arm abscess location:  L axilla Abscess quality: induration   Abscess quality: not draining   Duration:  1 day Progression:  Unchanged Chronicity:  New Relieved by: Tylenol  Worsened by:  Nothing tried Ineffective treatments:  None tried Associated symptoms: no fever     HPI Comments: Kayla Baird is a 24 y.o. female, with a PMhx of abscesses, who presents to the Emergency Department complaining of a gradually worsening area of pain and swelling in left axilla region onset 1 day ago with no associated drainage. She reports she took tylenol this morning. Pt denies fevers or chills.   Past Medical History  Diagnosis Date  . Yeast infection   . Chlamydia   . Medical history non-contributory    Past Surgical History  Procedure Laterality Date  . No past surgeries    . Eye surgery     Family History  Problem Relation Age of Onset  . Other Neg Hx   . Hypertension Mother    History  Substance Use Topics  . Smoking status: Current Every Day Smoker    Types: Cigarettes    Last Attempt to Quit: 12/29/2011  . Smokeless tobacco: Never Used  . Alcohol Use: Yes     Comment: social alcohol   OB History    Gravida Para Term Preterm AB TAB SAB Ectopic Multiple Living   Review of Systems  Constitutional: Negative for fever and chills.  Skin: Positive for wound.  All other systems reviewed and are negative.     Allergies  Review of patient's allergies  indicates no known allergies.  Home Medications   Prior to Admission medications   Medication Sig Start Date End Date Taking? Authorizing Provider  fluconazole (DIFLUCAN) 100 MG tablet Take 1 tablet (100 mg total) by mouth daily. 03/01/13   Hayden Rasmussen, NP  ondansetron (ZOFRAN) 4 MG tablet Take 1 tablet (4 mg total) by mouth every 8 (eight) hours as needed for nausea or vomiting. 07/02/13   Duane Lope, NP  Prenatal Vit-Fe Fumarate-FA (PRENATAL MULTIVITAMIN) TABS Take 1 tablet by mouth daily at 12 noon.    Historical Provider, MD  promethazine (PHENERGAN) 25 MG tablet Take 1 tablet (25 mg total) by mouth every 6 (six) hours as needed for nausea or vomiting. 07/02/13   Harolyn Rutherford Rasch, NP  terbinafine (LAMISIL) 1 % cream Apply topically 2 (two) times daily. 02/15/13   Minta Balsam, MD   BP 120/59 mmHg  Pulse 107  Temp(Src) 98.2 F (36.8 C)  Resp 20  Ht  (1.702 m)  Wt 180 lb (81.647 kg)  BMI 28.19 kg/m2  SpO2 100%  LMP 10/28/2014  Breastfeeding? No   Physical Exam  Constitutional: She is oriented to person, place, and time. She appears well-developed and well-nourished. No distress.  HENT:  Head: Normocephalic and atraumatic.  Eyes: Conjunctivae and EOM are normal.  Neck: Neck supple.  Cardiovascular: Normal rate.   Pulmonary/Chest: Breath sounds normal. No respiratory distress.  Neurological: She is alert and oriented to person, place, and time.  Skin: Skin is warm. There is erythema.  2x2cm induration in left axilla region   Psychiatric: Her behavior is normal.  Nursing note and vitals reviewed.   ED Course  Procedures   INCISION AND DRAINAGE Performed by: Jimmye NormanSMITH,Sekai Nayak JOHN Consent: Verbal consent obtained. Risks and benefits: risks, benefits and alternatives were discussed Type: abscess  Body area: left axilla  Anesthesia: local infiltration  Incision was made with a scalpel.  Local anesthetic: lidocaine 1%   Anesthetic total: 3 ml  Complexity:  complex  Blunt dissection to break up loculations  Drainage: purulent  Drainage amount: moderated  Patient tolerance: Patient tolerated the procedure well with no immediate complications.   DIAGNOSTIC STUDIES: Oxygen Saturation is 100% on RA, normal by my interpretation.    COORDINATION OF CARE: 11:39 PM Discussed treatment plan which includes an I&D procedure with pt. Pt acknowledges and agrees to plan.   Labs Review Labs Reviewed - No data to display  Imaging Review No results found.   EKG Interpretation None      MDM   Final diagnoses:  None    Left axilla abscess. I&D performed. Antibiotic coverage for localized cellulitis. Return precautions discussed.  I personally performed the services described in this documentation, which was scribed in my presence. The recorded information has been reviewed and is accurate.    Felicie Mornavid Torsha Lemus, NP 11/09/14 0206  Cy BlamerApril Palumbo, MD 11/09/14 [phone]

## 2014-11-09 MED ORDER — HYDROCODONE-ACETAMINOPHEN 5-325 MG PO TABS: 1.0000 | ORAL_TABLET | Freq: Four times a day (QID) | ORAL | Status: DC | PRN

## 2014-11-09 MED ORDER — CEPHALEXIN 500 MG PO CAPS: 500.0000 mg | ORAL_CAPSULE | Freq: Four times a day (QID) | ORAL | Status: DC

## 2014-11-09 NOTE — Discharge Instructions (Signed)

## 2015-07-20 ENCOUNTER — Emergency Department (HOSPITAL_COMMUNITY)
Admission: EM | Admit: 2015-07-20 | Discharge: 2015-07-20 | Disposition: A | Discharge status: Home / Self Care | Payer: Medicaid Other | Attending: Emergency Medicine | Admitting: Emergency Medicine

## 2015-07-20 ENCOUNTER — Encounter (HOSPITAL_COMMUNITY): Payer: Self-pay | Admitting: *Deleted

## 2015-07-20 DIAGNOSIS — Z8619 Personal history of other infectious and parasitic diseases: Secondary | ICD-10-CM | POA: Diagnosis not present

## 2015-07-20 DIAGNOSIS — F17210 Nicotine dependence, cigarettes, uncomplicated: Secondary | ICD-10-CM | POA: Diagnosis not present

## 2015-07-20 DIAGNOSIS — M546 Pain in thoracic spine: Secondary | ICD-10-CM | POA: Diagnosis not present

## 2015-07-20 DIAGNOSIS — Z792 Long term (current) use of antibiotics: Secondary | ICD-10-CM | POA: Insufficient documentation

## 2015-07-20 DIAGNOSIS — Z79899 Other long term (current) drug therapy: Secondary | ICD-10-CM | POA: Diagnosis not present

## 2015-07-20 MED ORDER — METHOCARBAMOL 500 MG PO TABS: 500.0000 mg | ORAL_TABLET | Freq: Two times a day (BID) | ORAL | Status: DC

## 2015-07-20 MED ORDER — NAPROXEN 500 MG PO TABS: 500.0000 mg | ORAL_TABLET | Freq: Two times a day (BID) | ORAL | Status: DC

## 2015-07-20 MED ORDER — NAPROXEN 250 MG PO TABS
500.0000 mg | ORAL_TABLET | Freq: Once | ORAL | Status: AC
  Administered 2015-07-20: 500 mg via ORAL
  Filled 2015-07-20: qty 2

## 2015-07-20 MED ORDER — METHOCARBAMOL 500 MG PO TABS
500.0000 mg | ORAL_TABLET | Freq: Once | ORAL | Status: AC
  Administered 2015-07-20: 500 mg via ORAL
  Filled 2015-07-20: qty 1

## 2015-07-20 NOTE — ED Notes (Signed)
The pt is c/o neck pain and thoracic pain for 2-3  Days.  No known injury  She does excercise

## 2015-07-20 NOTE — ED Provider Notes (Signed)
CSN: [id number]     Arrival date & time 07/20/15  2212 History   By signing my name below, I, Marisue Humble, attest that this documentation has been prepared under the direction and in the presence of non-physician practitioner, Santiago Glad, PA-C. Electronically Signed: Marisue Humble, Scribe. 07/20/2015. 10:55 PM.   Chief Complaint  Patient presents with  . Back Pain   The history is provided by the patient. No language interpreter was used.   HPI Comments:  Kayla Baird is a 25 y.o. female who presents to the Emergency Department complaining of non-radiating, mild, gradual onset mid-back pain beginning three days ago, worse on right side. Pt notes pain is worse with movement.  She reports physical activity at work and during exercise five days ago. Pt took a muscle relaxer with no relief. No alleviating factors noted. Pt denies recent injury, incontinence, and numbness or tingling in arms or legs.  No fever or chills.  No history of Cancer or IVDU.  Past Medical History  Diagnosis Date  . Yeast infection   . Chlamydia   . Medical history non-contributory    Past Surgical History  Procedure Laterality Date  . No past surgeries    . Eye surgery     Family History  Problem Relation Age of Onset  . Other Neg Hx   . Hypertension Mother    Social History  Substance Use Topics  . Smoking status: Current Every Day Smoker    Types: Cigarettes    Last Attempt to Quit: 12/29/2011  . Smokeless tobacco: Never Used  . Alcohol Use: Yes     Comment: social alcohol   OB History    Gravida Para Term Preterm AB TAB SAB Ectopic Multiple Living   Review of Systems  Genitourinary: Negative for enuresis.  Musculoskeletal: Positive for myalgias and back pain.  All other systems reviewed and are negative.  Allergies  Review of patient's allergies indicates no known allergies.  Home Medications   Prior to Admission medications   Medication Sig Start Date End  Date Taking? Authorizing Provider  cephALEXin (KEFLEX) 500 MG capsule Take 1 capsule (500 mg total) by mouth 4 (four) times daily. 11/09/14   Felicie Morn, NP  fluconazole (DIFLUCAN) 100 MG tablet Take 1 tablet (100 mg total) by mouth daily. 03/01/13   Hayden Rasmussen, NP  HYDROcodone-acetaminophen (NORCO/VICODIN) 5-325 MG per tablet Take 1 tablet by mouth every 6 (six) hours as needed for severe pain. 11/09/14   Felicie Morn, NP  ondansetron (ZOFRAN) 4 MG tablet Take 1 tablet (4 mg total) by mouth every 8 (eight) hours as needed for nausea or vomiting. 07/02/13   Duane Lope, NP  Prenatal Vit-Fe Fumarate-FA (PRENATAL MULTIVITAMIN) TABS Take 1 tablet by mouth daily at 12 noon.    Historical Provider, MD  promethazine (PHENERGAN) 25 MG tablet Take 1 tablet (25 mg total) by mouth every 6 (six) hours as needed for nausea or vomiting. 07/02/13   Harolyn Rutherford Rasch, NP  terbinafine (LAMISIL) 1 % cream Apply topically 2 (two) times daily. 02/15/13   Minta Balsam, MD   BP 113/69 mmHg  Pulse 76  Temp(Src) 97.7 F (36.5 C) (Oral)  Resp 20  Wt 189 lb (85.73 kg)  SpO2 100%  LMP 07/20/2015 Physical Exam  Constitutional: She appears well-developed and well-nourished. No distress.  HENT:  Head: Normocephalic and atraumatic.  Neck: Normal range of motion.  Neck supple.  Cardiovascular: Normal rate, regular rhythm and normal heart sounds.   Pulmonary/Chest: Effort normal and breath sounds normal. No respiratory distress.  Musculoskeletal: Normal range of motion.  No TTP of cervical, thoracic, or lumbar spine Thoracic right side paraspinal tenderness  Neurological: She is alert. No sensory deficit. Coordination normal.  Reflex Scores:      Patellar reflexes are 2+ on the right side and 2+ on the left side. 5/5 muscle strength UE and LE  Skin: Skin is warm and dry. No rash noted. She is not diaphoretic.  Psychiatric: She has a normal mood and affect. Her behavior is normal.  Nursing note and vitals  reviewed.   ED Course  Procedures  DIAGNOSTIC STUDIES:  Oxygen Saturation is 100% on RA, normal by my interpretation.    COORDINATION OF CARE:  10:53 PM Will prescribe a muscle relaxer. Recommended ice and light stretching. Discussed treatment plan with pt at bedside and pt agreed to plan.  Labs Review Labs Reviewed - No data to display  Imaging Review No results found. I have personally reviewed and evaluated these images and lab results as part of my medical decision-making.   EKG Interpretation None     MDM   Final diagnoses:  None  Patient with back pain.  No neurological deficits and normal neuro exam.  Patient can walk but states is painful.  No loss of bowel or bladder control.  No concern for cauda equina.  No fever, night sweats, weight loss, h/o cancer, IVDU.  Patient stable for discharge.  Return precautions given.    I personally performed the services described in this documentation, which was scribed in my presence. The recorded information has been reviewed and is accurate.    Santiago Glad, PA-C 07/21/15 1610  Arby Barrette, MD 07/28/15 [phone]

## 2015-07-20 NOTE — Discharge Instructions (Signed)
Do not drive or operate heavy machinery for 4 hours after taking muscle relaxer. °

## 2015-07-20 NOTE — ED Notes (Signed)
See PA note for secondary assessment.   

## 2015-12-03 ENCOUNTER — Encounter (HOSPITAL_COMMUNITY): Payer: Self-pay | Admitting: *Deleted

## 2015-12-03 ENCOUNTER — Emergency Department (HOSPITAL_COMMUNITY)
Admission: EM | Admit: 2015-12-03 | Discharge: 2015-12-03 | Disposition: A | Discharge status: Home / Self Care | Payer: Medicaid Other | Attending: Emergency Medicine | Admitting: Emergency Medicine

## 2015-12-03 DIAGNOSIS — Z5321 Procedure and treatment not carried out due to patient leaving prior to being seen by health care provider: Secondary | ICD-10-CM | POA: Insufficient documentation

## 2015-12-03 DIAGNOSIS — F17210 Nicotine dependence, cigarettes, uncomplicated: Secondary | ICD-10-CM | POA: Insufficient documentation

## 2015-12-03 DIAGNOSIS — F419 Anxiety disorder, unspecified: Secondary | ICD-10-CM | POA: Insufficient documentation

## 2015-12-03 NOTE — ED Notes (Signed)
Per EMS - patient comes from work at [employer] after an argument with her sister in which she became upset.  Patient c/o tingling in her hands which has now resolved.  Patient was calm on scene, but wanted to come to ED.  Patient denies N/V/D, fever, dizziness and chest pain.  Vitals on scene 110/70, HR 86, 97% on RA.

## 2015-12-03 NOTE — ED Notes (Signed)
MD went to see pt 2x and pt was not in room. Assumed pt left prior to being seen.

## 2016-04-21 ENCOUNTER — Emergency Department (HOSPITAL_COMMUNITY)
Admission: EM | Admit: 2016-04-21 | Discharge: 2016-04-21 | Disposition: A | Discharge status: Home / Self Care | Payer: Medicaid Other | Attending: Physician Assistant | Admitting: Physician Assistant

## 2016-04-21 ENCOUNTER — Encounter (HOSPITAL_COMMUNITY): Payer: Self-pay | Admitting: Emergency Medicine

## 2016-04-21 DIAGNOSIS — S86001A Unspecified injury of right Achilles tendon, initial encounter: Secondary | ICD-10-CM

## 2016-04-21 DIAGNOSIS — F17210 Nicotine dependence, cigarettes, uncomplicated: Secondary | ICD-10-CM | POA: Insufficient documentation

## 2016-04-21 DIAGNOSIS — Y999 Unspecified external cause status: Secondary | ICD-10-CM | POA: Insufficient documentation

## 2016-04-21 DIAGNOSIS — W109XXA Fall (on) (from) unspecified stairs and steps, initial encounter: Secondary | ICD-10-CM | POA: Insufficient documentation

## 2016-04-21 DIAGNOSIS — Y9301 Activity, walking, marching and hiking: Secondary | ICD-10-CM | POA: Insufficient documentation

## 2016-04-21 DIAGNOSIS — Y92240 Courthouse as the place of occurrence of the external cause: Secondary | ICD-10-CM | POA: Insufficient documentation

## 2016-04-21 MED ORDER — IBUPROFEN 800 MG PO TABS: 800.0000 mg | ORAL_TABLET | Freq: Three times a day (TID) | ORAL | 0 refills | Status: DC

## 2016-04-21 NOTE — ED Provider Notes (Signed)
[facility]-EMERGENCY DEPT Provider Note   CSN: [id number] Arrival date & time: 04/21/16  1610  By signing my name below, I, Sonum Patel, attest that this documentation has been prepared under the direction and in the presence of News CorporationSamantha Hilery Wintle PA-C. Electronically Signed: Sonum Patel, Neurosurgeoncribe. 04/21/16. 4:34 PM.  History   Chief Complaint Chief Complaint  Patient presents with  . Ankle Pain    The history is provided by the patient. No language interpreter was used.     HPI Comments: Kayla Baird is a 25 y.o. female who presents to the Emergency Department complaining of right ankle pain that began earlier today. Patient states she was walking down steps when she slipped on the next to last step and fell onto her right leg. She is unsure if she twisted the ankle or if it struck the ground. She states initially the entire right leg had pain but now it is only to the posterior right ankle. She states the pain is worse with walking. She denies difficulty walking, numbness, weakness.   Past Medical History:  Diagnosis Date  . Chlamydia   . Medical history non-contributory   . Yeast infection     There are no active problems to display for this patient.   Past Surgical History:  Procedure Laterality Date  . EYE SURGERY    . NO PAST SURGERIES      OB History    Gravida Para Term Preterm AB Living   3 2 2     2    SAB TAB Ectopic Multiple Live Births           2       Home Medications    Prior to Admission medications   Medication Sig Start Date End Date Taking? Authorizing Provider  naproxen (NAPROSYN) 500 MG tablet Take 1 tablet (500 mg total) by mouth 2 (two) times daily. Patient not taking: Reported on 12/03/2015 07/20/15   Santiago GladHeather Laisure, PA-C    Family History Family History  Problem Relation Age of Onset  . Other Neg Hx   . Hypertension Mother     Social History Social History  Substance Use Topics  . Smoking status: Current Every Day Smoker    Types:  Cigarettes    Last attempt to quit: 12/29/2011  . Smokeless tobacco: Never Used  . Alcohol use Yes     Comment: social alcohol     Allergies   Patient has no known allergies.   Review of Systems Review of Systems  Musculoskeletal: Positive for arthralgias and myalgias. Negative for gait problem and joint swelling.  Neurological: Negative for weakness and numbness.     Physical Exam Updated Vital Signs BP 126/75 (BP Location: Left Arm)   Pulse 80   Temp 97.5 F (36.4 C) (Oral)   Resp 16   Ht 5\' 7"  (1.702 m)   Wt 185 lb (83.9 kg)   SpO2 100%   BMI 28.98 kg/m   Physical Exam  Constitutional: She is oriented to person, place, and time. She appears well-developed and well-nourished. No distress.  HENT:  Head: Normocephalic and atraumatic.  Eyes: Conjunctivae are normal. Right eye exhibits no discharge. Left eye exhibits no discharge. No scleral icterus.  Cardiovascular: Normal rate.   Pulmonary/Chest: Effort normal.  Musculoskeletal: Normal range of motion. She exhibits tenderness. She exhibits no deformity.  TTP over right achilles tendon. Pain in achilles with dorsiflexion. No obvious bony deformity. Intact distal pulses.   Neurological: She is alert and oriented  to person, place, and time. Coordination normal.  Skin: Skin is warm and dry. No rash noted. She is not diaphoretic. No erythema. No pallor.  Psychiatric: She has a normal mood and affect. Her behavior is normal.  Nursing note and vitals reviewed.    ED Treatments / Results  DIAGNOSTIC STUDIES: Oxygen Saturation is 100% on RA, normal by my interpretation.    COORDINATION OF CARE: 4:36 PM X-ray was offered, but pt declined.    Labs (all labs ordered are listed, but only abnormal results are displayed) Labs Reviewed - No data to display  EKG  EKG Interpretation None       Radiology No results found.  Procedures Procedures (including critical care time)  Medications Ordered in ED Medications  - No data to display   Initial Impression / Assessment and Plan / ED Course  I have reviewed the triage vital signs and the nursing notes.  Pertinent labs & imaging results that were available during my care of the patient were reviewed by me and considered in my medical decision making (see chart for details).  Clinical Course     Pt was offered X-ray, but she declined. Tenderness is mostly on achilles tendon. No indication of rupture as pt is still able to dorsi and plantar flex. Pt advised to follow up with orthopedics. Patient given ASO brace while in ED, conservative therapy recommended and discussed. Patient will be discharged home & is agreeable with above plan. Returns precautions discussed. Pt appears safe for discharge.   Final Clinical Impressions(s) / ED Diagnoses   Final diagnoses:  Injury of right Achilles tendon, initial encounter    New Prescriptions New Prescriptions   IBUPROFEN (ADVIL,MOTRIN) 800 MG TABLET    Take 1 tablet (800 mg total) by mouth 3 (three) times daily.   I personally performed the services described in this documentation, which was scribed in my presence. The recorded information has been reviewed and is accurate.     Lester KinsmanSamantha Tripp MellottDowless, PA-C 04/21/16 1719    Courteney Randall AnLyn Mackuen, MD 04/22/16 [phone]

## 2016-04-21 NOTE — ED Notes (Signed)
Pt declined ankle xray, Sam PA aware

## 2016-04-21 NOTE — Discharge Instructions (Signed)
Wear brace for increased stability. Take ibuProfen as needed for pain. Apply ice to affected area. Follow-up with orthopedics if your symptoms do not improve.

## 2016-04-21 NOTE — Progress Notes (Signed)
Orthopedic Tech Progress Note Patient Details:  Lacretia NicksKatrina L Vanwingerden 06/25/1990 [id number]  Ortho Devices Type of Ortho Device: ASO Ortho Device/Splint Location: Rt ankle Ortho Device/Splint Interventions: Application   Clois DupesAvery S Crystale Giannattasio 04/21/2016, 5:18 PM

## 2016-04-21 NOTE — ED Triage Notes (Signed)
Pt sts right ankle pain [place] court [place] today

## 2016-04-21 NOTE — ED Notes (Signed)
Pt reports slipped and fell while in court today.  Twisting her R ankle.  No obvious deformity noted.  Pt is ambulatory with weight bearing and slight limp.

## 2016-05-29 ENCOUNTER — Encounter (HOSPITAL_COMMUNITY): Payer: Self-pay | Admitting: *Deleted

## 2016-05-29 ENCOUNTER — Emergency Department (HOSPITAL_COMMUNITY)
Admission: EM | Admit: 2016-05-29 | Discharge: 2016-05-29 | Disposition: A | Discharge status: Home / Self Care | Payer: Medicaid Other | Attending: Emergency Medicine | Admitting: Emergency Medicine

## 2016-05-29 DIAGNOSIS — Z79899 Other long term (current) drug therapy: Secondary | ICD-10-CM | POA: Insufficient documentation

## 2016-05-29 DIAGNOSIS — L02411 Cutaneous abscess of right axilla: Secondary | ICD-10-CM

## 2016-05-29 DIAGNOSIS — F17210 Nicotine dependence, cigarettes, uncomplicated: Secondary | ICD-10-CM | POA: Insufficient documentation

## 2016-05-29 MED ORDER — SULFAMETHOXAZOLE-TRIMETHOPRIM 800-160 MG PO TABS: 1.0000 | ORAL_TABLET | Freq: Two times a day (BID) | ORAL | 0 refills | Status: AC

## 2016-05-29 MED ORDER — LIDOCAINE-EPINEPHRINE (PF) 2 %-1:200000 IJ SOLN
20.0000 mL | Freq: Once | INTRAMUSCULAR | Status: AC
  Administered 2016-05-29: 20 mL
  Filled 2016-05-29: qty 20

## 2016-05-29 MED ORDER — OXYCODONE-ACETAMINOPHEN 5-325 MG PO TABS
1.0000 | ORAL_TABLET | Freq: Once | ORAL | Status: AC
  Administered 2016-05-29: 1 via ORAL
  Filled 2016-05-29: qty 1

## 2016-05-29 MED ORDER — OXYCODONE-ACETAMINOPHEN 5-325 MG PO TABS: 1.0000 | ORAL_TABLET | ORAL | 0 refills | Status: DC | PRN

## 2016-05-29 NOTE — Discharge Instructions (Signed)
Return to the emergency department with any new concerns. This condition would be best evaluated and definitively treated by surgery. Take medications as prescribed.

## 2016-05-29 NOTE — ED Provider Notes (Signed)
[facility]-EMERGENCY DEPT Provider Note   CSN: [id number] Arrival date & time: 05/29/16  0250     History   Chief Complaint Chief Complaint  Patient presents with  . Abscess    HPI Kayla Baird is a 25 y.o. female.  Patient presents with abscess underneath her right arm for the past 2 days. 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 Denies any history of hidradenitis. No fever. No vomiting. Believes his abscess came in because she used a different deodorant. It is not bleeding or draining. She came in today because the pain is worse.   The history is provided by the patient.  Abscess  Associated symptoms: no fever, no headaches, no nausea and no vomiting     Past Medical History:  Diagnosis Date  . Chlamydia   . Medical history non-contributory   . Yeast infection     There are no active problems to display for this patient.   Past Surgical History:  Procedure Laterality Date  . EYE SURGERY    . NO PAST SURGERIES      OB History    Gravida Para Term Preterm AB Living   3 2 2     2    SAB TAB Ectopic Multiple Live Births           2       Home Medications    Prior to Admission medications   Medication Sig Start Date End Date Taking? Authorizing Provider  ibuprofen (ADVIL,MOTRIN) 800 MG tablet Take 1 tablet (800 mg total) by mouth 3 (three) times daily. 04/21/16   Samantha Tripp Dowless, PA-C  naproxen (NAPROSYN) 500 MG tablet Take 1 tablet (500 mg total) by mouth 2 (two) times daily. Patient not taking: Reported on 12/03/2015 07/20/15   Santiago GladHeather Laisure, PA-C  oxyCODONE-acetaminophen (PERCOCET/ROXICET) 5-325 MG tablet Take 1 tablet by mouth every 4 (four) hours as needed for severe pain. 05/29/16   Elpidio AnisShari Upstill, PA-C  sulfamethoxazole-trimethoprim (BACTRIM DS,SEPTRA DS) 800-160 MG tablet Take 1 tablet by mouth 2 (two) times daily. 05/29/16 06/05/16  Elpidio AnisShari Upstill, PA-C    Family History Family History  Problem Relation Age of Onset  .  Hypertension Mother   . Other Neg Hx     Social History Social History  Substance Use Topics  . Smoking status: Current Every Day Smoker    Types: Cigarettes    Last attempt to quit: 12/29/2011  . Smokeless tobacco: Never Used  . Alcohol use Yes     Comment: social alcohol     Allergies   Patient has no known allergies.   Review of Systems Review of Systems  Constitutional: Negative for activity change, appetite change and fever.  HENT: Negative for congestion and rhinorrhea.   Respiratory: Negative for cough, chest tightness and shortness of breath.   Gastrointestinal: Negative for abdominal pain, nausea and vomiting.  Genitourinary: Negative for dysuria and hematuria.  Musculoskeletal: Negative for arthralgias and myalgias.  Skin: Positive for wound.  Neurological: Negative for dizziness, weakness, light-headedness and headaches.   A complete 10 system review of systems was obtained and all systems are negative except as noted in the HPI and PMH.    Physical Exam Updated Vital Signs BP 109/82   Pulse 98   Temp 97.8 F (36.6 C) (Oral)   Resp 18   Ht 5\' 7"  (1.702 m)   Wt 185 lb 1 oz (83.9 kg)   LMP 05/07/2016   SpO2 100%   BMI 28.98 kg/m  Physical Exam  Constitutional: She is oriented to person, place, and time. She appears well-developed and well-nourished. No distress.  HENT:  Head: Normocephalic and atraumatic.  Mouth/Throat: Oropharynx is clear and moist. No oropharyngeal exudate.  Eyes: Conjunctivae and EOM are normal. Pupils are equal, round, and reactive to light.  Neck: Normal range of motion. Neck supple.  No meningismus.  Cardiovascular: Normal rate, regular rhythm, normal heart sounds and intact distal pulses.   No murmur heard. Pulmonary/Chest: Effort normal and breath sounds normal. No respiratory distress.  Abdominal: Soft. There is no tenderness. There is no rebound and no guarding.  Musculoskeletal: Normal range of motion. She exhibits no  edema or tenderness.  R axilla with previous scarring suspicious for hidradenitis. 2 cm area of fluctuance without surrounding erythema  Neurological: She is alert and oriented to person, place, and time. No cranial nerve deficit. She exhibits normal muscle tone. Coordination normal.   5/5 strength throughout. CN 2-12 intact.Equal grip strength.   Skin: Skin is warm. Capillary refill takes less than 2 seconds.  Psychiatric: She has a normal mood and affect. Her behavior is normal.  Nursing note and vitals reviewed.    ED Treatments / Results  Labs (all labs ordered are listed, but only abnormal results are displayed) Labs Reviewed - No data to display  EKG  EKG Interpretation None       Radiology No results found.  Procedures Procedures (including critical care time)  Medications Ordered in ED Medications  lidocaine-EPINEPHrine (XYLOCAINE W/EPI) 2 %-1:200000 (PF) injection 20 mL (20 mLs Infiltration Given 05/29/16 0441)  oxyCODONE-acetaminophen (PERCOCET/ROXICET) 5-325 MG per tablet 1 tablet (1 tablet Oral Given 05/29/16 0602)     Initial Impression / Assessment and Plan / ED Course  I have reviewed the triage vital signs and the nursing notes.  Pertinent labs & imaging results that were available during my care of the patient were reviewed by me and considered in my medical decision making (see chart for details).  Clinical Course    Axillary abscess, likely hidradenitis.  Patient is well-appearing and nontoxic. Her history is concerning for hidradenitis though never been diagnosed.  Abscess drained by PAC Upstill.  Wound care instructions given, warm soaks, antibiotics, followup with surgery.    Final Clinical Impressions(s) / ED Diagnoses   Final diagnoses:  Abscess of axilla, right    New Prescriptions Discharge Medication List as of 05/29/2016  6:06 AM    START taking these medications   Details  oxyCODONE-acetaminophen (PERCOCET/ROXICET) 5-325 MG  tablet Take 1 tablet by mouth every 4 (four) hours as needed for severe pain., Starting Sat 05/29/2016, Print    sulfamethoxazole-trimethoprim (BACTRIM DS,SEPTRA DS) 800-160 MG tablet Take 1 tablet by mouth 2 (two) times daily., Starting Sat 05/29/2016, Until Sat 06/05/2016, Print         Glynn OctaveStephen Durenda Pechacek, MD 05/29/16 [phone]

## 2016-05-29 NOTE — ED Triage Notes (Signed)
The pt c/o an abscess under her rt arm for a day.  Hx of abscesses under her arm  She usually does not wear deodorant but did earlier today  lmp nov 24th

## 2016-05-29 NOTE — ED Provider Notes (Signed)
INCISION AND DRAINAGE Performed by: Elpidio AnisUPSTILL, Christabel Camire A Consent: Verbal consent obtained. Risks and benefits: risks, benefits and alternatives were discussed Type: abscess  Body area: right axilla  Anesthesia: local infiltration  Incision was made with a scalpel.  Local anesthetic: lidocaine 2% w/epinephrine  Anesthetic total: 3 ml  Complexity: complex Blunt dissection to break up loculations  Drainage: purulent  Drainage amount: moderate  Packing material: none  Patient tolerance: Patient tolerated the procedure well with no immediate complications.      Elpidio AnisShari Tanashia Ciesla, PA-C 05/29/16 0600    Glynn OctaveStephen Rancour, MD 05/29/16 [phone]

## 2016-05-29 NOTE — ED Notes (Signed)
EDP at bedside  

## 2016-06-16 ENCOUNTER — Encounter (HOSPITAL_COMMUNITY): Payer: Self-pay | Admitting: Emergency Medicine

## 2016-06-16 ENCOUNTER — Emergency Department (HOSPITAL_COMMUNITY): Payer: No Typology Code available for payment source

## 2016-06-16 ENCOUNTER — Emergency Department (HOSPITAL_COMMUNITY)
Admission: EM | Admit: 2016-06-16 | Discharge: 2016-06-17 | Disposition: A | Discharge status: Home / Self Care | Payer: No Typology Code available for payment source | Attending: Emergency Medicine | Admitting: Emergency Medicine

## 2016-06-16 DIAGNOSIS — S0083XA Contusion of other part of head, initial encounter: Secondary | ICD-10-CM | POA: Insufficient documentation

## 2016-06-16 DIAGNOSIS — Y999 Unspecified external cause status: Secondary | ICD-10-CM | POA: Insufficient documentation

## 2016-06-16 DIAGNOSIS — Y92410 Unspecified street and highway as the place of occurrence of the external cause: Secondary | ICD-10-CM | POA: Diagnosis not present

## 2016-06-16 DIAGNOSIS — S161XXA Strain of muscle, fascia and tendon at neck level, initial encounter: Secondary | ICD-10-CM | POA: Insufficient documentation

## 2016-06-16 DIAGNOSIS — R930 Abnormal findings on diagnostic imaging of skull and head, not elsewhere classified: Secondary | ICD-10-CM | POA: Diagnosis not present

## 2016-06-16 DIAGNOSIS — S199XXA Unspecified injury of neck, initial encounter: Secondary | ICD-10-CM | POA: Diagnosis present

## 2016-06-16 DIAGNOSIS — Y939 Activity, unspecified: Secondary | ICD-10-CM | POA: Insufficient documentation

## 2016-06-16 DIAGNOSIS — Z79899 Other long term (current) drug therapy: Secondary | ICD-10-CM | POA: Insufficient documentation

## 2016-06-16 DIAGNOSIS — F17210 Nicotine dependence, cigarettes, uncomplicated: Secondary | ICD-10-CM | POA: Diagnosis not present

## 2016-06-16 MED ORDER — NAPROXEN 500 MG PO TABS: 500.0000 mg | ORAL_TABLET | Freq: Two times a day (BID) | ORAL | 0 refills | Status: DC

## 2016-06-16 MED ORDER — METHOCARBAMOL 500 MG PO TABS
500.0000 mg | ORAL_TABLET | Freq: Once | ORAL | Status: AC
  Administered 2016-06-16: 500 mg via ORAL
  Filled 2016-06-16: qty 1

## 2016-06-16 MED ORDER — METHOCARBAMOL 500 MG PO TABS: 500.0000 mg | ORAL_TABLET | Freq: Two times a day (BID) | ORAL | 0 refills | Status: DC

## 2016-06-16 MED ORDER — OXYCODONE-ACETAMINOPHEN 5-325 MG PO TABS
2.0000 | ORAL_TABLET | Freq: Once | ORAL | Status: AC
  Administered 2016-06-16: 2 via ORAL
  Filled 2016-06-16: qty 2
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
## 2016-06-16 NOTE — Discharge Instructions (Signed)
1. Medications: robaxin, naproxyn, usual home medications 2. Treatment: rest, drink plenty of fluids, gentle stretching as discussed, alternate ice and heat 3. Follow Up: Please followup with your primary doctor in 3-5 days for discussion of your diagnoses and further evaluation after today's visit; if you do not have a primary care doctor use the resource guide provided to find one;  Return to the ER for worsening back pain, difficulty walking, loss of bowel or bladder control or other concerning symptoms     

## 2016-12-09 ENCOUNTER — Inpatient Hospital Stay (HOSPITAL_COMMUNITY)
Admission: AD | Admit: 2016-12-09 | Discharge: 2016-12-09 | Disposition: A | Discharge status: Home / Self Care | Payer: Medicaid Other | Source: Ambulatory Visit | Attending: Obstetrics and Gynecology | Admitting: Obstetrics and Gynecology

## 2016-12-09 ENCOUNTER — Encounter (HOSPITAL_COMMUNITY): Payer: Self-pay

## 2016-12-09 DIAGNOSIS — Z3202 Encounter for pregnancy test, result negative: Secondary | ICD-10-CM

## 2016-12-09 DIAGNOSIS — Z79899 Other long term (current) drug therapy: Secondary | ICD-10-CM | POA: Insufficient documentation

## 2016-12-09 DIAGNOSIS — F17210 Nicotine dependence, cigarettes, uncomplicated: Secondary | ICD-10-CM | POA: Insufficient documentation

## 2016-12-09 DIAGNOSIS — O209 Hemorrhage in early pregnancy, unspecified: Secondary | ICD-10-CM

## 2016-12-09 DIAGNOSIS — R1030 Lower abdominal pain, unspecified: Secondary | ICD-10-CM | POA: Insufficient documentation

## 2016-12-09 DIAGNOSIS — Z3009 Encounter for other general counseling and advice on contraception: Secondary | ICD-10-CM

## 2016-12-09 DIAGNOSIS — N939 Abnormal uterine and vaginal bleeding, unspecified: Secondary | ICD-10-CM

## 2016-12-09 LAB — URINALYSIS, ROUTINE W REFLEX MICROSCOPIC
BILIRUBIN URINE: NEGATIVE
Bacteria, UA: NONE SEEN
Glucose, UA: NEGATIVE mg/dL
Ketones, ur: NEGATIVE mg/dL
LEUKOCYTES UA: NEGATIVE
NITRITE: NEGATIVE
PH: 7 (ref 5.0–8.0)
Protein, ur: NEGATIVE mg/dL
Specific Gravity, Urine: 1.009 (ref 1.005–1.030)

## 2016-12-09 LAB — POCT PREGNANCY, URINE: PREG TEST UR: NEGATIVE

## 2016-12-09 LAB — HIV ANTIBODY (ROUTINE TESTING W REFLEX): HIV Screen 4th Generation wRfx: NONREACTIVE

## 2016-12-09 LAB — CBC
HCT: 34.6 % — ABNORMAL LOW (ref 36.0–46.0)
Hemoglobin: 11.4 g/dL — ABNORMAL LOW (ref 12.0–15.0)
MCH: 25.2 pg — ABNORMAL LOW (ref 26.0–34.0)
MCHC: 32.9 g/dL (ref 30.0–36.0)
MCV: 76.5 fL — AB (ref 78.0–100.0)
Platelets: 208 10*3/uL (ref 150–400)
RBC: 4.52 MIL/uL (ref 3.87–5.11)
RDW: 18.4 % — ABNORMAL HIGH (ref 11.5–15.5)
WBC: 6.7 10*3/uL (ref 4.0–10.5)

## 2016-12-09 LAB — WET PREP, GENITAL
Clue Cells Wet Prep HPF POC: NONE SEEN
Sperm: NONE SEEN
Trich, Wet Prep: NONE SEEN
Yeast Wet Prep HPF POC: NONE SEEN

## 2016-12-09 LAB — HCG, QUANTITATIVE, PREGNANCY

## 2016-12-09 LAB — GC/CHLAMYDIA PROBE AMP (~~LOC~~) NOT AT ARMC
CHLAMYDIA, DNA PROBE: NEGATIVE
Neisseria Gonorrhea: NEGATIVE

## 2016-12-09 MED ORDER — NORGESTIMATE-ETH ESTRADIOL 0.25-35 MG-MCG PO TABS: 1.0000 | ORAL_TABLET | Freq: Every day | ORAL | 11 refills | Status: DC

## 2016-12-09 NOTE — MAU Provider Note (Signed)
Chief Complaint: Vaginal Bleeding and Abdominal Pain   None     SUBJECTIVE HPI: Kayla Baird is a 26 y.o. [id number] at 2420w4d by LMP who presents to maternity admissions reporting onset of vaginal bleeding like menstrual bleeding and mild lower abdominal cramping today. She had a positive home pregnancy test 1 month ago. She reports she took the pregnancy test because her period in late May was lighter and shorter than usual.  Today's bleeding is moderate in amount, requiring 2-3 pads today.  Her pain is intermittent, low in her abdomen, similar to menstrual cramps. She has not tried any treatments for bleeding or pain. There are no other associated symptoms.  This is not a desired pregnancy per the pt. She denies vaginal itching/burning, urinary symptoms, h/a, dizziness, n/v, or fever/chills.     HPI  Past Medical History:  Diagnosis Date  . Chlamydia   . Medical history non-contributory   . Yeast infection    Past Surgical History:  Procedure Laterality Date  . EYE SURGERY    . NO PAST SURGERIES     Social History   Social History  . Marital status: Single    Spouse name: N/A  . Number of children: N/A  . Years of education: N/A   Occupational History  . Not on file.   Social History Main Topics  . Smoking status: Current Every Day Smoker    Types: Cigarettes    Last attempt to quit: 12/29/2011  . Smokeless tobacco: Never Used  . Alcohol use Yes     Comment: social alcohol  . Drug use: No  . Sexual activity: Yes    Birth control/ protection: None   Other Topics Concern  . Not on file   Social History Narrative  . No narrative on file   No current facility-administered medications on file prior to encounter.    Current Outpatient Prescriptions on File Prior to Encounter  Medication Sig Dispense Refill  . ibuprofen (ADVIL,MOTRIN) 800 MG tablet Take 1 tablet (800 mg total) by mouth 3 (three) times daily. 21 tablet 0  . methocarbamol (ROBAXIN) 500 MG tablet Take 1  tablet (500 mg total) by mouth 2 (two) times daily. 20 tablet 0  . naproxen (NAPROSYN) 500 MG tablet Take 1 tablet (500 mg total) by mouth 2 (two) times daily with a meal. 30 tablet 0  . oxyCODONE-acetaminophen (PERCOCET/ROXICET) 5-325 MG tablet Take 1 tablet by mouth every 4 (four) hours as needed for severe pain. 8 tablet 0   No Known Allergies  ROS:  Review of Systems  Constitutional: Negative for chills, fatigue and fever.  Respiratory: Negative for shortness of breath.   Cardiovascular: Negative for chest pain.  Gastrointestinal: Positive for abdominal pain. Negative for nausea and vomiting.  Genitourinary: Positive for pelvic pain and vaginal bleeding. Negative for difficulty urinating, dysuria, flank pain, vaginal discharge and vaginal pain.  Neurological: Negative for dizziness and headaches.  Psychiatric/Behavioral: Negative.      I have reviewed patient's Past Medical Hx, Surgical Hx, Family Hx, Social Hx, medications and allergies.   Physical Exam   Patient Vitals for the past 24 hrs:  BP Temp Temp src Pulse Resp SpO2 Height Weight  12/09/16 0229 (!) 111/92 98 F (36.7 C) Oral 66 18 100 % 5\' 7"  (1.702 m) 184 lb (83.5 kg)   Constitutional: Well-developed, well-nourished female in no acute distress.  Cardiovascular: normal rate Respiratory: normal effort GI: Abd soft, non-tender. Pos BS x 4 MS: Extremities nontender, no  edema, normal ROM Neurologic: Alert and oriented x 4.  GU: Neg CVAT.  PELVIC EXAM: Cervix pink, visually closed, without lesion, small amount of dark red bleeding with small clots, vaginal walls and external genitalia normal Bimanual exam: Cervix 0/long/high, firm, anterior, neg CMT, uterus nontender, nonenlarged, adnexa without tenderness, enlargement, or mass    LAB RESULTS Results for orders placed or performed during the hospital encounter of 12/09/16 (from the past 24 hour(s))  Wet prep, genital     Status: Abnormal   Collection Time: 12/09/16   3:33 AM  Result Value Ref Range   Yeast Wet Prep HPF POC NONE SEEN NONE SEEN   Trich, Wet Prep NONE SEEN NONE SEEN   Clue Cells Wet Prep HPF POC NONE SEEN NONE SEEN   WBC, Wet Prep HPF POC FEW (A) NONE SEEN   Sperm NONE SEEN   CBC     Status: Abnormal   Collection Time: 12/09/16  3:37 AM  Result Value Ref Range   WBC 6.7 4.0 - 10.5 K/uL   RBC 4.52 3.87 - 5.11 MIL/uL   Hemoglobin 11.4 (L) 12.0 - 15.0 g/dL   HCT 16.1 (L) 09.6 - 04.5 %   MCV 76.5 (L) 78.0 - 100.0 fL   MCH 25.2 (L) 26.0 - 34.0 pg   MCHC 32.9 30.0 - 36.0 g/dL   RDW 40.9 (H) 81.1 - 91.4 %   Platelets 208 150 - 400 K/uL  hCG, quantitative, pregnancy     Status: None   Collection Time: 12/09/16  3:42 AM  Result Value Ref Range   hCG, Beta Chain, Quant, S <1 <5 mIU/mL  Pregnancy, urine POC     Status: None   Collection Time: 12/09/16  3:42 AM  Result Value Ref Range   Preg Test, Ur NEGATIVE NEGATIVE       IMAGING No results found.  MAU Management/MDM: Ordered labs and reviewed results.  UPT negative and quant hcg <1 so no evidence of recent pregnancy. May have been false positive pregnancy test at home.  Pt does not desire pregnancy so contraceptive counseling done with LARCs presented as most effective.  Pt desires IUD vs Nexplanon but is interested in OCPs until she gets device placed.  Rx for Sprintec 28.  Pt to f/u in [facility] office for LARC/Pap.  Pt stable at time of discharge.  ASSESSMENT 1. Pregnancy examination or test, negative result   2. Abnormal uterine bleeding (AUB)   3. General counseling and advice on contraceptive management     PLAN Discharge home with bleeding precautions  Allergies as of 12/09/2016   No Known Allergies     Medication List    STOP taking these medications   oxyCODONE-acetaminophen 5-325 MG tablet Commonly known as:  PERCOCET/ROXICET     TAKE these medications   ibuprofen 800 MG tablet Commonly known as:  ADVIL,MOTRIN Take 1 tablet (800 mg total) by mouth 3 (three)  times daily.   methocarbamol 500 MG tablet Commonly known as:  ROBAXIN Take 1 tablet (500 mg total) by mouth 2 (two) times daily.   naproxen 500 MG tablet Commonly known as:  NAPROSYN Take 1 tablet (500 mg total) by mouth 2 (two) times daily with a meal.   norgestimate-ethinyl estradiol 0.25-35 MG-MCG tablet Commonly known as:  ORTHO-CYCLEN,SPRINTEC,PREVIFEM Take 1 tablet by mouth daily.      Follow-up Information    Center for [facility] up.   Specialty:  Obstetrics and Gynecology Why:  For gyn care/contraception.  Return to MAU for gyn emergencies. Contact information: [address] [place] [address] [phone]          Sharen Counter Certified Nurse-Midwife 12/09/2016  5:11 AM

## 2016-12-09 NOTE — MAU Note (Signed)
Pt here with c/o vaginal bleeding and abdominal pain. Had +HPT at the beginning of June. Started having bleeding this afternoon at 2pm.

## 2017-06-17 ENCOUNTER — Encounter (HOSPITAL_COMMUNITY): Payer: Self-pay

## 2017-06-17 ENCOUNTER — Inpatient Hospital Stay (HOSPITAL_COMMUNITY): Payer: Self-pay

## 2017-06-17 ENCOUNTER — Inpatient Hospital Stay (HOSPITAL_COMMUNITY)
Admission: AD | Admit: 2017-06-17 | Discharge: 2017-06-17 | Disposition: A | Discharge status: Home / Self Care | Payer: Self-pay | Source: Ambulatory Visit | Attending: Obstetrics & Gynecology | Admitting: Obstetrics & Gynecology

## 2017-06-17 DIAGNOSIS — Z3491 Encounter for supervision of normal pregnancy, unspecified, first trimester: Secondary | ICD-10-CM

## 2017-06-17 DIAGNOSIS — O23592 Infection of other part of genital tract in pregnancy, second trimester: Secondary | ICD-10-CM | POA: Insufficient documentation

## 2017-06-17 DIAGNOSIS — B9689 Other specified bacterial agents as the cause of diseases classified elsewhere: Secondary | ICD-10-CM

## 2017-06-17 DIAGNOSIS — O99331 Smoking (tobacco) complicating pregnancy, first trimester: Secondary | ICD-10-CM | POA: Insufficient documentation

## 2017-06-17 DIAGNOSIS — O26891 Other specified pregnancy related conditions, first trimester: Secondary | ICD-10-CM | POA: Insufficient documentation

## 2017-06-17 DIAGNOSIS — N760 Acute vaginitis: Secondary | ICD-10-CM

## 2017-06-17 DIAGNOSIS — F17210 Nicotine dependence, cigarettes, uncomplicated: Secondary | ICD-10-CM | POA: Insufficient documentation

## 2017-06-17 DIAGNOSIS — Z79899 Other long term (current) drug therapy: Secondary | ICD-10-CM | POA: Insufficient documentation

## 2017-06-17 DIAGNOSIS — R109 Unspecified abdominal pain: Secondary | ICD-10-CM | POA: Insufficient documentation

## 2017-06-17 DIAGNOSIS — Z3A01 Less than 8 weeks gestation of pregnancy: Secondary | ICD-10-CM | POA: Insufficient documentation

## 2017-06-17 LAB — CBC
HCT: 31.3 % — ABNORMAL LOW (ref 36.0–46.0)
Hemoglobin: 10.6 g/dL — ABNORMAL LOW (ref 12.0–15.0)
MCH: 25.5 pg — ABNORMAL LOW (ref 26.0–34.0)
MCHC: 33.9 g/dL (ref 30.0–36.0)
MCV: 75.4 fL — ABNORMAL LOW (ref 78.0–100.0)
Platelets: 203 10*3/uL (ref 150–400)
RBC: 4.15 MIL/uL (ref 3.87–5.11)
RDW: 17 % — ABNORMAL HIGH (ref 11.5–15.5)
WBC: 6.5 10*3/uL (ref 4.0–10.5)

## 2017-06-17 LAB — WET PREP, GENITAL
Sperm: NONE SEEN
Trich, Wet Prep: NONE SEEN
Yeast Wet Prep HPF POC: NONE SEEN

## 2017-06-17 LAB — GC/CHLAMYDIA PROBE AMP (~~LOC~~) NOT AT ARMC
Chlamydia: NEGATIVE
Neisseria Gonorrhea: NEGATIVE

## 2017-06-17 LAB — URINALYSIS, ROUTINE W REFLEX MICROSCOPIC
Bilirubin Urine: NEGATIVE
Glucose, UA: NEGATIVE mg/dL
Hgb urine dipstick: NEGATIVE
Ketones, ur: NEGATIVE mg/dL
Nitrite: NEGATIVE
Protein, ur: NEGATIVE mg/dL
Specific Gravity, Urine: 1.019 (ref 1.005–1.030)
pH: 7 (ref 5.0–8.0)

## 2017-06-17 LAB — HCG, QUANTITATIVE, PREGNANCY: hCG, Beta Chain, Quant, S: 34310 m[IU]/mL — ABNORMAL HIGH (ref <5)

## 2017-06-17 LAB — POCT PREGNANCY, URINE: Preg Test, Ur: POSITIVE — AB

## 2017-06-17 LAB — ABO/RH: ABO/RH(D): O POS

## 2017-06-17 MED ORDER — METRONIDAZOLE 500 MG PO TABS: 500.0000 mg | ORAL_TABLET | Freq: Two times a day (BID) | ORAL | 0 refills | Status: DC

## 2017-06-17 NOTE — MAU Note (Signed)
Pt reports lower abdominal cramping that started today-states she does not currently have the pain. States she had a missed period last month. Did not take a upt at home. Pt denies vaginal bleeding or discharge.

## 2017-06-17 NOTE — Discharge Instructions (Signed)

## 2017-06-17 NOTE — MAU Provider Note (Signed)
Chief Complaint: Possible Pregnancy and Abdominal Pain   First Provider Initiated Contact with Patient 06/17/17 0211      SUBJECTIVE HPI: Kayla Baird is a 27 y.o. [id number] at [redacted]w[redacted]d by LMP who presents to maternity admissions reporting abdominal pain. She reports that she has been cramping intermittently for the past week. Rates pain 3/10- has not taken any medication for pain. States she has not taken UPT at home because she does not believe she is pregnant. Reports unprotected IC last month, not currently on BC. She denies vaginal bleeding, vaginal itching/burning, urinary symptoms, h/a, dizziness, n/v, or fever/chills.    Past Medical History:  Diagnosis Date  . Chlamydia   . Medical history non-contributory   . Yeast infection    Past Surgical History:  Procedure Laterality Date  . EYE SURGERY    . NO PAST SURGERIES     Social History   Socioeconomic History  . Marital status: Single    Spouse name: Not on file  . Number of children: Not on file  . Years of education: Not on file  . Highest education level: Not on file  Social Needs  . Financial resource strain: Not on file  . Food insecurity - worry: Not on file  . Food insecurity - inability: Not on file  . Transportation needs - medical: Not on file  . Transportation needs - non-medical: Not on file  Occupational History  . Not on file  Tobacco Use  . Smoking status: Current Every Day Smoker    Types: Cigarettes    Last attempt to quit: 12/29/2011    Years since quitting: 5.4  . Smokeless tobacco: Never Used  Substance and Sexual Activity  . Alcohol use: Yes    Comment: social alcohol  . Drug use: No  . Sexual activity: Yes    Birth control/protection: None  Other Topics Concern  . Not on file  Social History Narrative  . Not on file   No current facility-administered medications on file prior to encounter.    Current Outpatient Medications on File Prior to Encounter  Medication Sig Dispense Refill  .  ibuprofen (ADVIL,MOTRIN) 800 MG tablet Take 1 tablet (800 mg total) by mouth 3 (three) times daily. 21 tablet 0  . methocarbamol (ROBAXIN) 500 MG tablet Take 1 tablet (500 mg total) by mouth 2 (two) times daily. 20 tablet 0  . naproxen (NAPROSYN) 500 MG tablet Take 1 tablet (500 mg total) by mouth 2 (two) times daily with a meal. 30 tablet 0  . norgestimate-ethinyl estradiol (ORTHO-CYCLEN,SPRINTEC,PREVIFEM) 0.25-35 MG-MCG tablet Take 1 tablet by mouth daily. 1 Package 11   No Known Allergies  ROS:  Review of Systems  Constitutional: Negative.   Respiratory: Negative.   Cardiovascular: Negative.   Gastrointestinal: Positive for abdominal pain. Negative for constipation, diarrhea, nausea and vomiting.  Genitourinary: Negative.   Musculoskeletal: Negative.   Neurological: Negative.   Psychiatric/Behavioral: Negative.    I have reviewed patient's Past Medical Hx, Surgical Hx, Family Hx, Social Hx, medications and allergies.   Physical Exam   Patient Vitals for the past 24 hrs:  BP Temp Pulse Resp SpO2 Height Weight  06/17/17 0331 119/74 - 76 17 - - -  06/17/17 0139 126/63 98.1 F (36.7 C) 77 16 100 % - -  06/17/17 0131 - - - - - 5\' 7"  (1.702 m) 201 lb (91.2 kg)   Constitutional: Well-developed, well-nourished female in no acute distress.  Cardiovascular: normal rate Respiratory: normal effort GI:  Abd soft, non-tender. Pos BS x 4 MS: Extremities nontender, no edema, normal ROM Neurologic: Alert and oriented x 4.  GU: Neg CVAT.  PELVIC EXAM: Cervix pink, visually closed, without lesion, moderate grey discharge wit odor, vaginal walls and external genitalia normal Bimanual exam: Cervix 0/long/high, firm, anterior, neg CMT, uterus nontender, nonenlarged, adnexa without tenderness, enlargement, or mass  LAB RESULTS Results for orders placed or performed during the hospital encounter of 06/17/17 (from the past 24 hour(s))  Urinalysis, Routine w reflex microscopic     Status: Abnormal    Collection Time: 06/17/17  1:28 AM  Result Value Ref Range   Color, Urine YELLOW YELLOW   APPearance HAZY (A) CLEAR   Specific Gravity, Urine 1.019 1.005 - 1.030   pH 7.0 5.0 - 8.0   Glucose, UA NEGATIVE NEGATIVE mg/dL   Hgb urine dipstick NEGATIVE NEGATIVE   Bilirubin Urine NEGATIVE NEGATIVE   Ketones, ur NEGATIVE NEGATIVE mg/dL   Protein, ur NEGATIVE NEGATIVE mg/dL   Nitrite NEGATIVE NEGATIVE   Leukocytes, UA MODERATE (A) NEGATIVE   RBC / HPF 0-5 0 - 5 RBC/hpf   WBC, UA 0-5 0 - 5 WBC/hpf   Bacteria, UA RARE (A) NONE SEEN   Squamous Epithelial / LPF 0-5 (A) NONE SEEN   Mucus PRESENT   Pregnancy, urine POC     Status: Abnormal   Collection Time: 06/17/17  1:46 AM  Result Value Ref Range   Preg Test, Ur POSITIVE (A) NEGATIVE  CBC     Status: Abnormal   Collection Time: 06/17/17  1:55 AM  Result Value Ref Range   WBC 6.5 4.0 - 10.5 K/uL   RBC 4.15 3.87 - 5.11 MIL/uL   Hemoglobin 10.6 (L) 12.0 - 15.0 g/dL   HCT 16.131.3 (L) 09.636.0 - 04.546.0 %   MCV 75.4 (L) 78.0 - 100.0 fL   MCH 25.5 (L) 26.0 - 34.0 pg   MCHC 33.9 30.0 - 36.0 g/dL   RDW 40.917.0 (H) 81.111.5 - 91.415.5 %   Platelets 203 150 - 400 K/uL  ABO/Rh     Status: None   Collection Time: 06/17/17  1:55 AM  Result Value Ref Range   ABO/RH(D) O POS   hCG, quantitative, pregnancy     Status: Abnormal   Collection Time: 06/17/17  1:55 AM  Result Value Ref Range   hCG, Beta Chain, Quant, S 34,310 (H) <5 mIU/mL  Wet prep, genital     Status: Abnormal   Collection Time: 06/17/17  2:15 AM  Result Value Ref Range   Yeast Wet Prep HPF POC NONE SEEN NONE SEEN   Trich, Wet Prep NONE SEEN NONE SEEN   Clue Cells Wet Prep HPF POC PRESENT (A) NONE SEEN   WBC, Wet Prep HPF POC FEW (A) NONE SEEN   Sperm NONE SEEN       IMAGING [place] Ob Less Than 14 Weeks With Ob Transvaginal  Result Date: 06/17/2017 CLINICAL DATA:  Pregnant patient in first-trimester pregnancy with abdominal cramping. EXAM: OBSTETRIC <14 WK US AND TRANSVAGINAL OB US TECHNIQUE:  Both transabdominal and transvaginal ultrasound examinations were performed for complete evaluation of the gestation as well as the maternal uterus, adnexal regions, and pelvic cul-de-sac. Transvaginal technique was performed to assess early pregnancy. COMPARISON:  None. FINDINGS: Intrauterine gestational sac: Single Yolk sac:  Visualized. Embryo:  Visualized. Cardiac Activity: Visualized. Heart Rate: 123  bpm CRL:  5.8  mm   6 w   2 d  [place] EDC: 02/08/2018 Subchorionic hemorrhage: Small lateral to the gestational sac measuring 1.8 x 1.0 x 0.7 cm. Maternal uterus/adnexae: Corpus luteal cyst in the right ovary. Left ovary is normal. No pelvic free fluid. IMPRESSION: Single live intrauterine pregnancy estimated gestational age [redacted] weeks 2 days by crown-rump length for estimated date of delivery 02/08/2018. Small subchorionic hemorrhage. Electronically Signed   By: Rubye Oaks M.D.   On: 06/17/2017 03:01    MAU Management/MDM: Orders Placed This Encounter  Procedures  . Wet prep, genital  . US OB LESS THAN 14 WEEKS WITH OB TRANSVAGINAL  . Urinalysis, Routine w reflex microscopic  . CBC  . hCG, quantitative, pregnancy  . Pregnancy, urine POC  . ABO/Rh  . Discharge patient Discharge disposition: 01-Home or Self Care; Discharge patient date: 06/17/2017  Wet prep- showed BV, will treat with Flagyl  GC/C- pending   Meds ordered this encounter  Medications  . metroNIDAZOLE (FLAGYL) 500 MG tablet    Sig: Take 1 tablet (500 mg total) by mouth 2 (two) times daily.    Dispense:  14 tablet    Refill:  0    Order Specific Question:   Supervising Provider    Answer:   Willodean Rosenthal [[id number]]    Pt discharged with instructions to schedule initial prenatal appointment.  ASSESSMENT 1. Normal IUP (intrauterine pregnancy) on prenatal ultrasound, first trimester   2. Abdominal pain during pregnancy in first trimester   3. BV (bacterial vaginosis)     PLAN Discharge home Rx for  Flagyl for BV Return to MAU as needed for emergencies  Make initial appointment with chosen provider for prenatal care  Follow-up Information    Center for [facility] .   Specialty:  Obstetrics and Gynecology Contact information: [address] [place] [address] [phone]       [facility] [place] MATERNITY ADMISSIONS Follow up.   Why:  Return to MAU as needed for emergencies Contact information: [address] [id number] [place] [address] [phone]          Allergies as of 06/17/2017   No Known Allergies     Medication List    STOP taking these medications   ibuprofen 800 MG tablet Commonly known as:  ADVIL,MOTRIN   methocarbamol 500 MG tablet Commonly known as:  ROBAXIN   naproxen 500 MG tablet Commonly known as:  NAPROSYN   norgestimate-ethinyl estradiol 0.25-35 MG-MCG tablet Commonly known as:  ORTHO-CYCLEN,SPRINTEC,PREVIFEM     TAKE these medications   metroNIDAZOLE 500 MG tablet Commonly known as:  FLAGYL Take 1 tablet (500 mg total) by mouth 2 (two) times daily.       Steward Drone  Certified Nurse-Midwife 06/17/2017  3:18 AM

## 2018-05-03 ENCOUNTER — Encounter (HOSPITAL_COMMUNITY): Payer: Self-pay

## 2018-07-24 ENCOUNTER — Other Ambulatory Visit: Payer: Self-pay

## 2018-07-24 ENCOUNTER — Encounter (HOSPITAL_COMMUNITY): Payer: Self-pay

## 2018-07-24 ENCOUNTER — Ambulatory Visit (HOSPITAL_COMMUNITY)
Admission: EM | Admit: 2018-07-24 | Discharge: 2018-07-24 | Disposition: A | Discharge status: Home / Self Care | Payer: Self-pay | Attending: Family Medicine | Admitting: Family Medicine

## 2018-07-24 DIAGNOSIS — L02411 Cutaneous abscess of right axilla: Secondary | ICD-10-CM

## 2018-07-24 MED ORDER — CEPHALEXIN 500 MG PO CAPS: 500.0000 mg | ORAL_CAPSULE | Freq: Four times a day (QID) | ORAL | 0 refills | Status: DC

## 2018-07-24 NOTE — ED Triage Notes (Signed)
Pt cc she has a abscess under her right armpit x 2 days.

## 2018-07-24 NOTE — Discharge Instructions (Signed)
Start keflex as directed. You can remove current dressing in 24 hours. Keep wound clean and dry. You can clean gently with soap and water. Do not soak area in water. Monitor for spreading redness, increased warmth, increased swelling, fever, follow up for reevaluation needed. 

## 2018-07-24 NOTE — ED Notes (Signed)
Pt called to room 7 no answer x2.

## 2018-07-24 NOTE — ED Notes (Signed)
Pt called to room 3 with no answer x1.

## 2018-07-24 NOTE — ED Provider Notes (Signed)
[facility]    CSN: [id number] Arrival date & time: 07/24/18  1654     History   Chief Complaint Chief Complaint  Patient presents with  . Abscess    HPI Kayla Baird is a 28 y.o. female.   28 year old female comes in for 2 day history of right axilla swelling, pain, redness. States gets recurrent axilla abscesses and has been evaluation in the past by surgery. States she used Deoderant recently and may be why symptoms started. Swelling has increased since 2 days ago. Denies spreading erythema, warmth. Denies fever, chills, night sweats. Has not tried anything for the symptoms.      Past Medical History:  Diagnosis Date  . Chlamydia   . Medical history non-contributory   . Yeast infection     There are no active problems to display for this patient.   Past Surgical History:  Procedure Laterality Date  . EYE SURGERY    . NO PAST SURGERIES      OB History    Gravida  4   Para  2   Term  2   Preterm      AB  1   Living  2     SAB      TAB  1   Ectopic      Multiple      Live Births  2            Home Medications    Prior to Admission medications   Medication Sig Start Date End Date Taking? Authorizing Provider  cephALEXin (KEFLEX) 500 MG capsule Take 1 capsule (500 mg total) by mouth 4 (four) times daily. 07/24/18   Cathie Hoops, Lamanda Rudder V, PA-C  metroNIDAZOLE (FLAGYL) 500 MG tablet Take 1 tablet (500 mg total) by mouth 2 (two) times daily. 06/17/17   Sharyon Cable, CNM    Family History Family History  Problem Relation Age of Onset  . Hypertension Mother   . Other Neg Hx     Social History Social History   Tobacco Use  . Smoking status: Current Every Day Smoker    Types: Cigarettes    Last attempt to quit: 12/29/2011    Years since quitting: 6.5  . Smokeless tobacco: Never Used  Substance Use Topics  . Alcohol use: Yes    Comment: social alcohol  . Drug use: No     Allergies   Patient has no known  allergies.   Review of Systems Review of Systems  Reason unable to perform ROS: See HPI as above.     Physical Exam Triage Vital Signs ED Triage Vitals  Enc Vitals Group     BP 07/24/18 1815 118/67     Pulse Rate 07/24/18 1815 66     Resp 07/24/18 1815 18     Temp 07/24/18 1815 98.4 F (36.9 C)     Temp src --      SpO2 07/24/18 1815 100 %     Weight 07/24/18 1816 185 lb (83.9 kg)     Height --      Head Circumference --      Peak Flow --      Pain Score 07/24/18 1816 10     Pain Loc --      Pain Edu? --      Excl. in GC? --    No data found.  Updated Vital Signs BP 118/67 (BP Location: Right Arm)   Pulse 66   Temp  98.4 F (36.9 C)   Resp 18   Wt 185 lb (83.9 kg)   LMP 07/19/2018   SpO2 100%   BMI 28.98 kg/m    Physical Exam Constitutional:      General: She is not in acute distress.    Appearance: She is well-developed. She is not ill-appearing, toxic-appearing or diaphoretic.  HENT:     Head: Normocephalic and atraumatic.  Eyes:     Conjunctiva/sclera: Conjunctivae normal.     Pupils: Pupils are equal, round, and reactive to light.  Skin:    General: Skin is warm and dry.     Comments: 0.5cm x 1cm abscess to the right axilla. Surrounding erythema with warmth.   Neurological:     Mental Status: She is alert and oriented to person, place, and time.      UC Treatments / Results  Labs (all labs ordered are listed, but only abnormal results are displayed) Labs Reviewed - No data to display  EKG None  Radiology No results found.  Procedures Incision and Drainage Date/Time: 07/24/2018 7:07 PM Performed by: Belinda Fisher, PA-C Authorized by: Frederica Kuster, MD   Consent:    Consent obtained:  Verbal   Consent given by:  Patient   Risks discussed:  Bleeding, infection, incomplete drainage, pain and damage to other organs   Alternatives discussed:  Alternative treatment and no treatment Location:    Type:  Abscess   Size:  1cm x 0.5cm    Location:  Upper extremity   Upper extremity location: right axilla. Pre-procedure details:    Skin preparation:  Chloraprep Anesthesia (see MAR for exact dosages):    Anesthesia method:  Local infiltration   Local anesthetic:  Lidocaine 2% WITH epi Procedure type:    Complexity:  Simple Procedure details:    Needle aspiration: no     Incision types:  Single straight   Incision depth:  Dermal   Scalpel blade:  11   Wound management:  Probed and deloculated   Drainage:  Purulent   Drainage amount:  Scant   Wound treatment:  Wound left open   Packing materials:  None Post-procedure details:    Patient tolerance of procedure:  Tolerated well, no immediate complications   (including critical care time)  Medications Ordered in UC Medications - No data to display  Initial Impression / Assessment and Plan / UC Course  I have reviewed the triage vital signs and the nursing notes.  Pertinent labs & imaging results that were available during my care of the patient were reviewed by me and considered in my medical decision making (see chart for details).    Patient tolerated procedure well. Start keflex for surrounding cellulitis. Wound care instructions given. Return precautions given. Patient expresses understanding and agrees to plan.   Final Clinical Impressions(s) / UC Diagnoses   Final diagnoses:  Abscess of axilla, right    ED Prescriptions    Medication Sig Dispense Auth. Provider   cephALEXin (KEFLEX) 500 MG capsule Take 1 capsule (500 mg total) by mouth 4 (four) times daily. 28 capsule Threasa Alpha, [place] 07/24/18 1908

## 2019-04-16 IMAGING — US US OB < 14 WEEKS - US OB TV
1 series · 15 of 28 positions shown · non-contrast
Comparison: None.

CLINICAL DATA: Pregnant patient in first-trimester pregnancy with
abdominal cramping.

EXAM:
OBSTETRIC <14 WK US AND TRANSVAGINAL OB US
TECHNIQUE: Both transabdominal and transvaginal ultrasound examinations were
performed for complete evaluation of the gestation as well as the
maternal uterus, adnexal regions, and pelvic cul-de-sac.
Transvaginal technique was performed to assess early pregnancy.

[Series 1: us ob < 14 weeks - us ob tv · 15 of 57 slices shown]
[im 1/57]
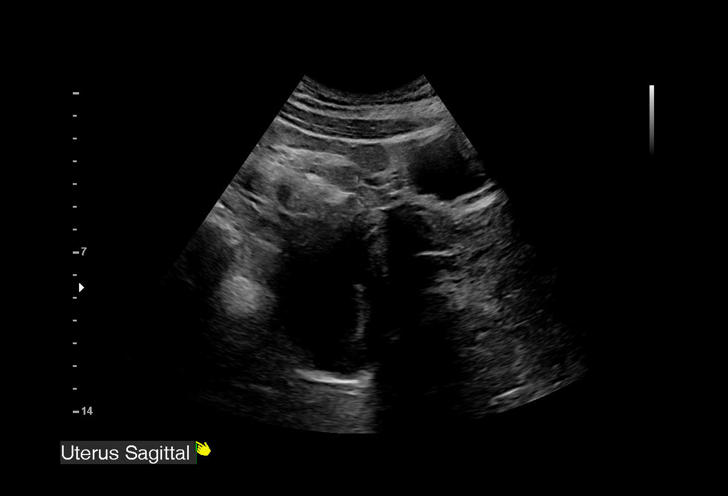
[im 5/57]
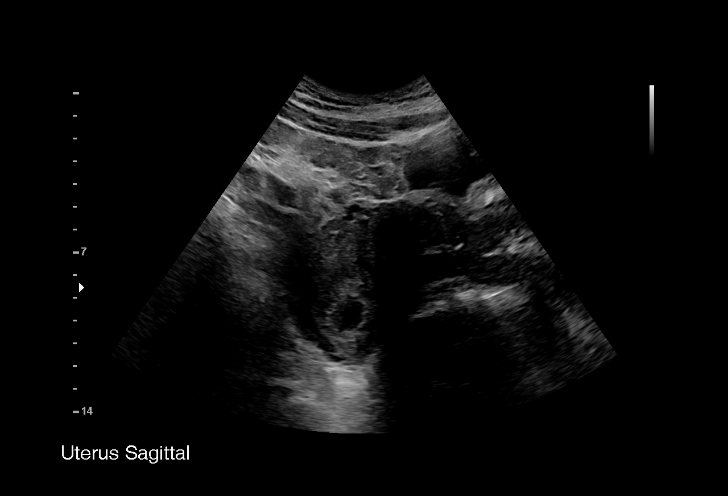
[im 9/57]
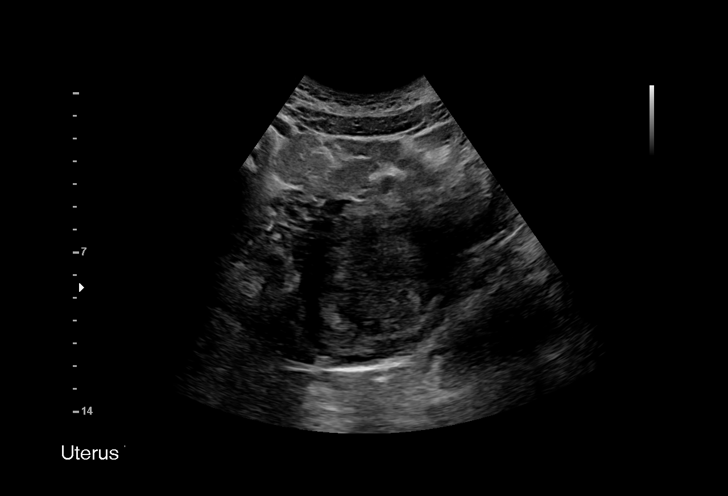
[im 13/57]
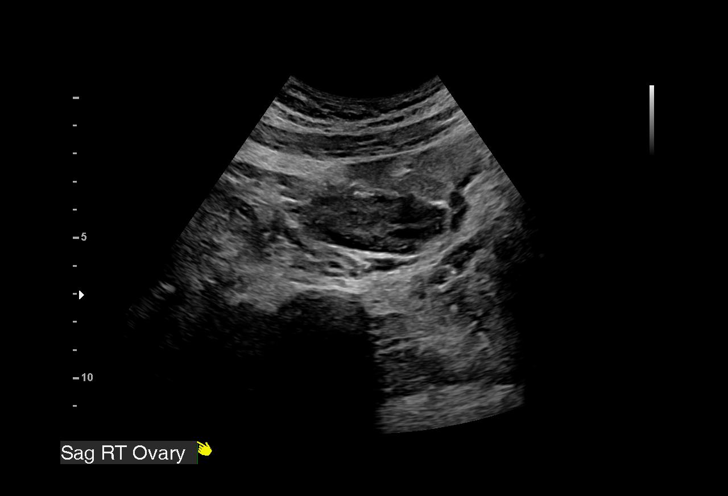
[im 17/57]
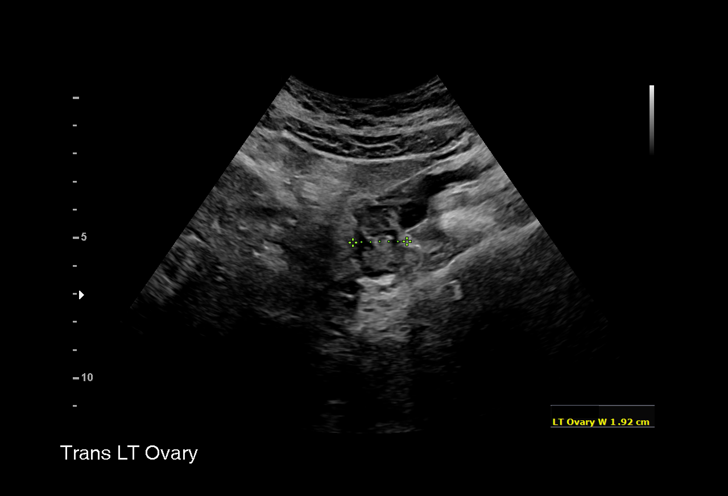
[im 21/57]
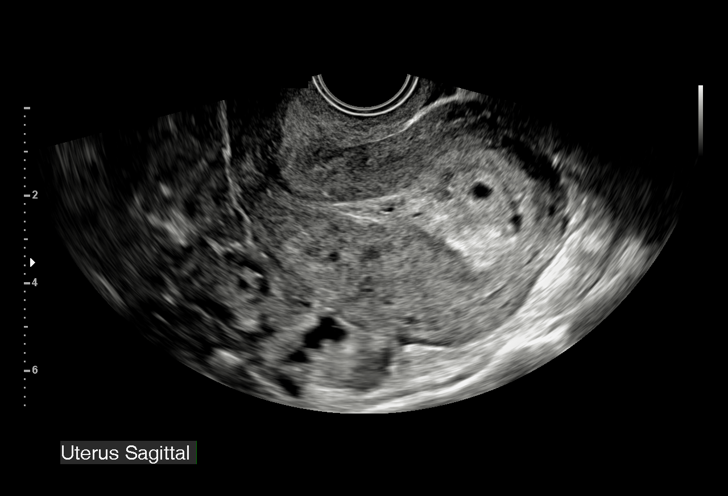
[im 25/57]
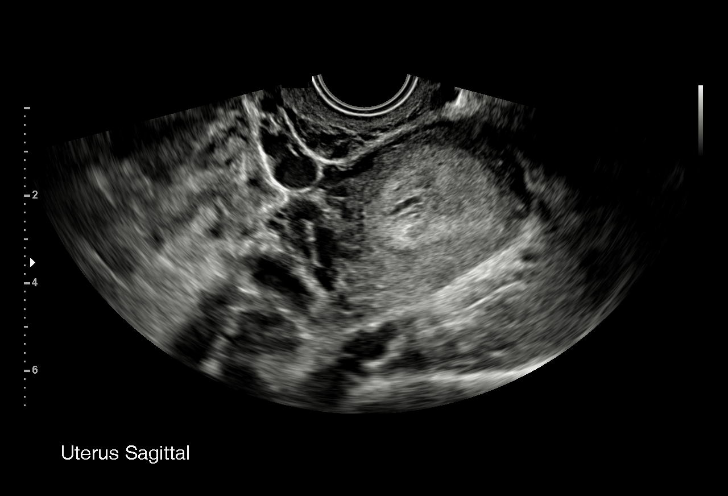
[im 30/57]
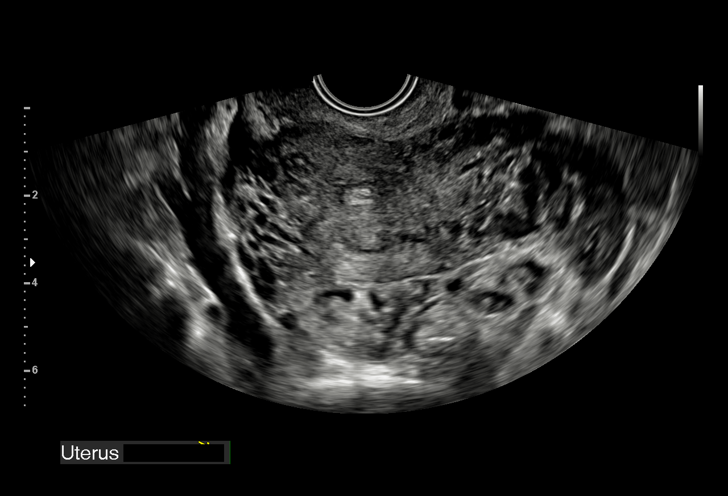
[im 32/57]
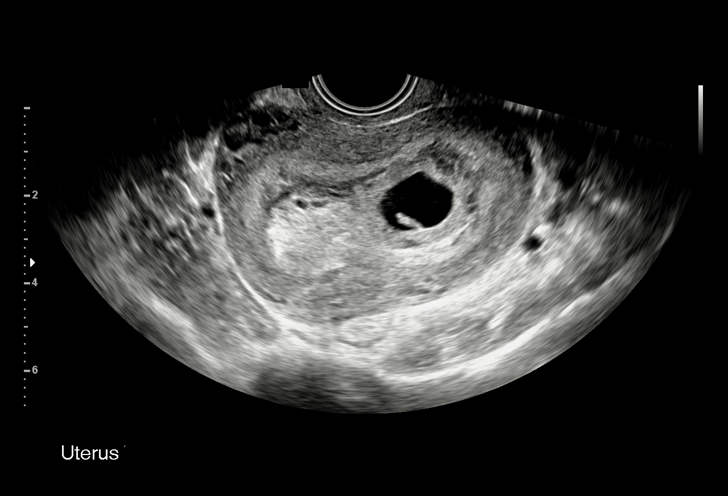
[im 36/57]
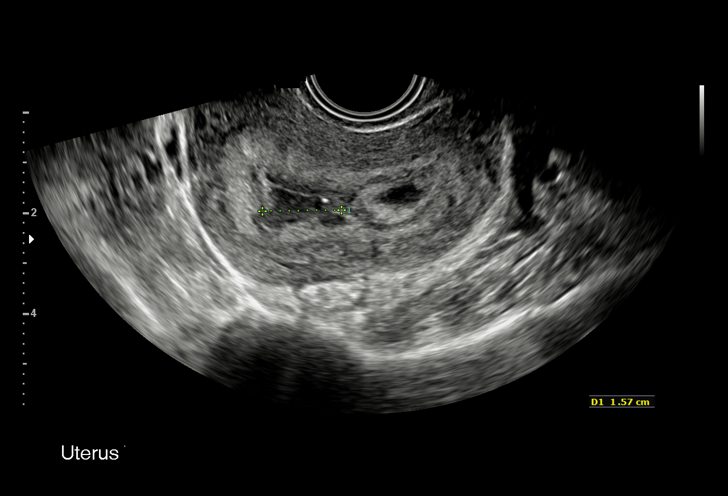
[im 40/57]
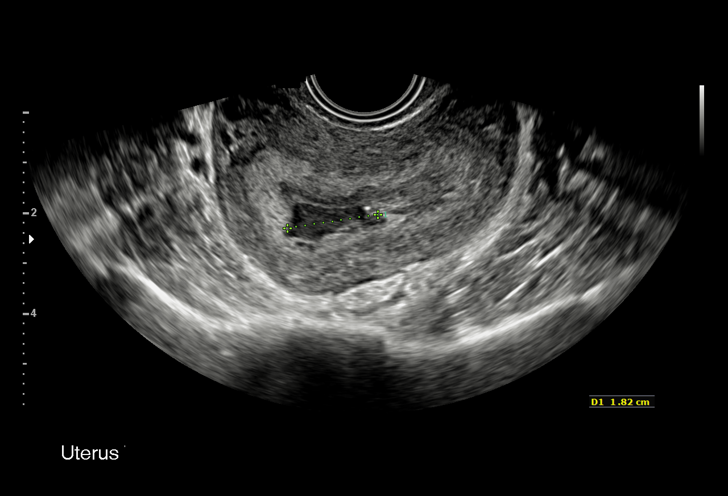
[im 44/57]
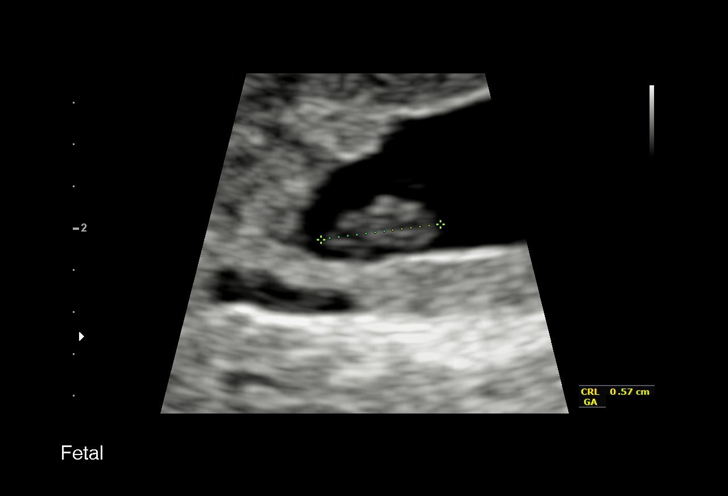
[im 48/57]
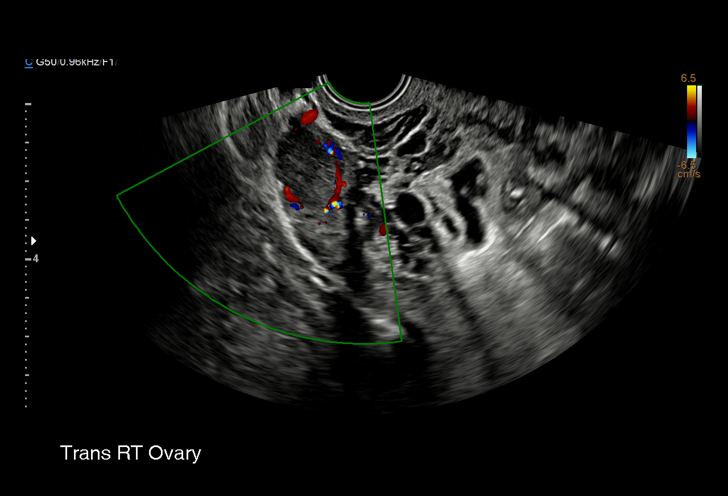
[im 52/57]
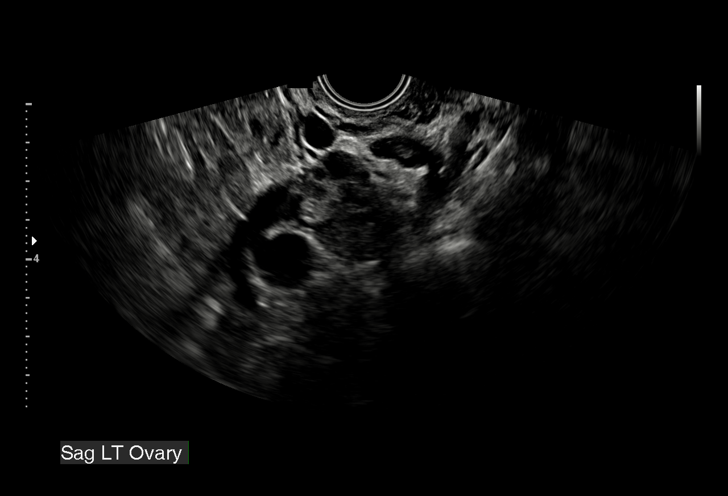
[im 57/57]
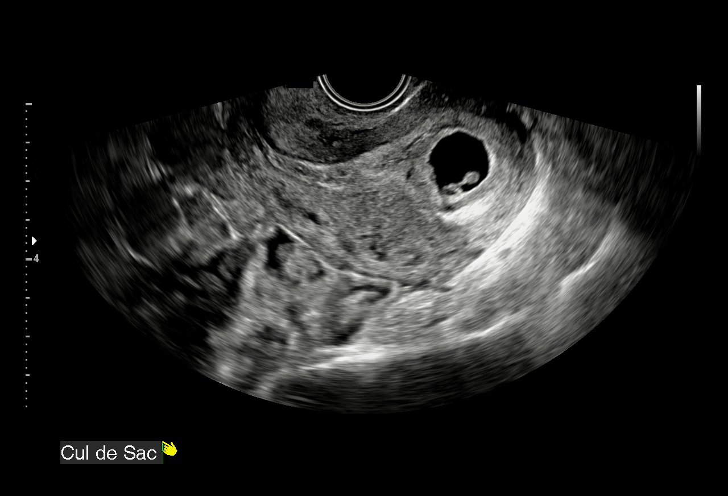

[15 of 28 positions shown; findings below may reference images not displayed]

FINDINGS: Intrauterine gestational sac: Single

Yolk sac:  Visualized.

Embryo:  Visualized.

Cardiac Activity: Visualized.

Heart Rate: 123  bpm

CRL:  5.8  mm   6 w   2 d                  US EDC: 02/08/2018

Subchorionic hemorrhage: Small lateral to the gestational sac
measuring 1.8 x 1.0 x 0.7 cm.

Maternal uterus/adnexae: Corpus luteal cyst in the right ovary. Left
ovary is normal. No pelvic free fluid.
IMPRESSION: Single live intrauterine pregnancy estimated gestational age 6 weeks
2 days by crown-rump length for estimated date of delivery
02/08/2018.

Small subchorionic hemorrhage.

## 2020-07-13 ENCOUNTER — Ambulatory Visit (HOSPITAL_COMMUNITY)
Admission: EM | Admit: 2020-07-13 | Discharge: 2020-07-13 | Disposition: A | Discharge status: Home / Self Care | Payer: Self-pay | Attending: Family Medicine | Admitting: Family Medicine

## 2020-07-13 ENCOUNTER — Encounter (HOSPITAL_COMMUNITY): Payer: Self-pay

## 2020-07-13 DIAGNOSIS — L02419 Cutaneous abscess of limb, unspecified: Secondary | ICD-10-CM

## 2020-07-13 MED ORDER — CEPHALEXIN 500 MG PO CAPS: 500.0000 mg | ORAL_CAPSULE | Freq: Two times a day (BID) | ORAL | 0 refills | Status: DC

## 2020-07-13 MED ORDER — HYDROCODONE-ACETAMINOPHEN 5-325 MG PO TABS: 1.0000 | ORAL_TABLET | Freq: Three times a day (TID) | ORAL | 0 refills | Status: DC | PRN

## 2020-07-13 MED ORDER — LIDOCAINE-EPINEPHRINE 1 %-1:100000 IJ SOLN
INTRAMUSCULAR | Status: AC
  Filled 2020-07-13: qty 1

## 2020-07-13 MED ORDER — HIBICLENS 4 % EX LIQD: Freq: Every day | CUTANEOUS | 0 refills | Status: DC | PRN

## 2020-07-13 NOTE — ED Provider Notes (Signed)
[facility]    CSN: [id number] Arrival date & time: 07/13/20  1204      History   Chief Complaint Chief Complaint  Patient presents with  . Cyst    HPI Kayla Baird is a 30 y.o. female.   Here today with painful swollen red area of right axilla that she noticed yesterday and has worsened since then. States she gets recurrent abscesses in this area and has had numerous I and D procedures. Has tried warm compresses without benefit at home. Denies fever, chills, drainage, body aches, sweats.      Past Medical History:  Diagnosis Date  . Chlamydia   . Medical history non-contributory   . Yeast infection     There are no problems to display for this patient.   Past Surgical History:  Procedure Laterality Date  . EYE SURGERY    . NO PAST SURGERIES      OB History    Gravida  4   Para  2   Term  2   Preterm      AB  1   Living  2     SAB      IAB  1   Ectopic      Multiple      Live Births  2            Home Medications    Prior to Admission medications   Medication Sig Start Date End Date Taking? Authorizing Provider  chlorhexidine (HIBICLENS) 4 % external liquid Apply topically daily as needed. 07/13/20  Yes Particia Nearing, PA-C  cephALEXin (KEFLEX) 500 MG capsule Take 1 capsule (500 mg total) by mouth 2 (two) times daily. 07/13/20   Particia Nearing, PA-C  HYDROcodone-acetaminophen (NORCO/VICODIN) 5-325 MG tablet Take 1 tablet by mouth 3 (three) times daily as needed for moderate pain. 07/13/20   Particia Nearing, PA-C  metroNIDAZOLE (FLAGYL) 500 MG tablet Take 1 tablet (500 mg total) by mouth 2 (two) times daily. 06/17/17   Sharyon Cable, CNM    Family History Family History  Problem Relation Age of Onset  . Hypertension Mother   . Other Neg Hx     Social History Social History   Tobacco Use  . Smoking status: Current Every Day Smoker    Types: Cigarettes    Last attempt to quit: 12/29/2011     Years since quitting: 8.5  . Smokeless tobacco: Never Used  Substance Use Topics  . Alcohol use: Yes    Comment: social alcohol  . Drug use: No     Allergies   Patient has no known allergies.   Review of Systems Review of Systems PER HPI    Physical Exam Triage Vital Signs ED Triage Vitals  Enc Vitals Group     BP 07/13/20 1308 117/75     Pulse Rate 07/13/20 1308 70     Resp 07/13/20 1308 16     Temp 07/13/20 1308 98.9 F (37.2 C)     Temp Source 07/13/20 1308 Oral     SpO2 07/13/20 1308 100 %     Weight --      Height --      Head Circumference --      Peak Flow --      Pain Score 07/13/20 1307 8     Pain Loc --      Pain Edu? --      Excl. in GC? --  No data found.  Updated Vital Signs BP 117/75 (BP Location: Right Arm)   Pulse 70   Temp 98.9 F (37.2 C) (Oral)   Resp 16   LMP 06/14/2020   SpO2 100%   Visual Acuity Right Eye Distance:   Left Eye Distance:   Bilateral Distance:    Right Eye Near:   Left Eye Near:    Bilateral Near:     Physical Exam Vitals and nursing note reviewed.  Constitutional:      Appearance: Normal appearance. She is not ill-appearing.  HENT:     Head: Atraumatic.  Eyes:     Extraocular Movements: Extraocular movements intact.     Conjunctiva/sclera: Conjunctivae normal.  Cardiovascular:     Rate and Rhythm: Normal rate and regular rhythm.     Heart sounds: Normal heart sounds.  Pulmonary:     Effort: Pulmonary effort is normal.     Breath sounds: Normal breath sounds.  Musculoskeletal:        General: Swelling and tenderness (erythematous, semi fluctuant edematous boil to right axilla) present. Normal range of motion.     Cervical back: Normal range of motion and neck supple.  Skin:    General: Skin is warm and dry.     Findings: Erythema present.     Comments: Significant scarring to right axilla from past I and D procedures  Neurological:     Mental Status: She is alert and oriented to person, place, and  time.  Psychiatric:        Mood and Affect: Mood normal.        Thought Content: Thought content normal.        Judgment: Judgment normal.      UC Treatments / Results  Labs (all labs ordered are listed, but only abnormal results are displayed) Labs Reviewed - No data to display  EKG   Radiology No results found.  Procedures Incision and Drainage  Date/Time: 07/13/2020 2:14 PM Performed by: Particia Nearing, PA-C Authorized by: Particia Nearing, PA-C   Consent:    Consent obtained:  Verbal   Consent given by:  Patient   Risks, benefits, and alternatives were discussed: yes     Risks discussed:  Bleeding, incomplete drainage and infection   Alternatives discussed:  Alternative treatment Universal protocol:    Procedure explained and questions answered to patient or proxy's satisfaction: yes     Relevant documents present and verified: yes     Test results available : yes     Imaging studies available: yes     Required blood products, implants, devices, and special equipment available: yes     Site/side marked: yes     Immediately prior to procedure, a time out was called: yes     Patient identity confirmed:  Verbally with patient and arm band Location:    Type:  Abscess   Location: right axilla. Pre-procedure details:    Skin preparation:  Chlorhexidine with alcohol Sedation:    Sedation type:  None Anesthesia:    Anesthesia method:  Local infiltration   Local anesthetic:  Lidocaine 2% WITH epi Procedure type:    Complexity:  Simple Procedure details:    Ultrasound guidance: yes     Needle aspiration: no     Incision types:  Stab incision   Incision depth:  Dermal   Wound management:  Probed and deloculated   Drainage:  Purulent   Drainage amount:  Moderate   Wound treatment:  Wound left  open   Packing materials:  None Post-procedure details:    Procedure completion:  Tolerated well, no immediate complications   (including critical care  time)  Medications Ordered in UC Medications - No data to display  Initial Impression / Assessment and Plan / UC Course  I have reviewed the triage vital signs and the nursing notes.  Pertinent labs & imaging results that were available during my care of the patient were reviewed by me and considered in my medical decision making (see chart for details).     I and D performed today without complication, will start keflex, hibiclens, continue warm compresses. F/u with Dermatology as she has been considering excision surgery given recurrences and scarring.  Return for acutely worsening sxs in meantime.   Final Clinical Impressions(s) / UC Diagnoses   Final diagnoses:  Abscess of axilla   Discharge Instructions   None    ED Prescriptions    Medication Sig Dispense Auth. Provider   cephALEXin (KEFLEX) 500 MG capsule Take 1 capsule (500 mg total) by mouth 2 (two) times daily. 14 capsule Particia Nearing, [place]   HYDROcodone-acetaminophen (NORCO/VICODIN) 5-325 MG tablet  (Status: Discontinued) Take 1 tablet by mouth 3 (three) times daily as needed for moderate pain. 10 tablet Particia Nearing, PA-C   chlorhexidine (HIBICLENS) 4 % external liquid Apply topically daily as needed. 200 mL Particia Nearing, [place]   HYDROcodone-acetaminophen (NORCO/VICODIN) 5-325 MG tablet Take 1 tablet by mouth 3 (three) times daily as needed for moderate pain. 10 tablet Particia Nearing, [place]     I have reviewed the PDMP during this encounter.   Particia Nearing, [place] 07/13/20 1415

## 2020-07-13 NOTE — ED Triage Notes (Signed)
Pt present cyst under right armpit. Pt states she notice this yesterday. Pt states the area is painful to touch and she noticed it yesterday.

## 2023-09-20 ENCOUNTER — Encounter (HOSPITAL_COMMUNITY): Payer: Self-pay | Admitting: Obstetrics and Gynecology

## 2023-09-20 ENCOUNTER — Inpatient Hospital Stay (HOSPITAL_COMMUNITY)

## 2023-09-20 ENCOUNTER — Inpatient Hospital Stay (HOSPITAL_COMMUNITY)
Admission: AD | Admit: 2023-09-20 | Discharge: 2023-09-20 | Disposition: A | Discharge status: Home / Self Care | Attending: Obstetrics and Gynecology | Admitting: Obstetrics and Gynecology

## 2023-09-20 DIAGNOSIS — R109 Unspecified abdominal pain: Secondary | ICD-10-CM

## 2023-09-20 DIAGNOSIS — O039 Complete or unspecified spontaneous abortion without complication: Secondary | ICD-10-CM | POA: Insufficient documentation

## 2023-09-20 DIAGNOSIS — Z3A10 10 weeks gestation of pregnancy: Secondary | ICD-10-CM

## 2023-09-20 DIAGNOSIS — R52 Pain, unspecified: Secondary | ICD-10-CM

## 2023-09-20 DIAGNOSIS — O23591 Infection of other part of genital tract in pregnancy, first trimester: Secondary | ICD-10-CM

## 2023-09-20 DIAGNOSIS — N939 Abnormal uterine and vaginal bleeding, unspecified: Secondary | ICD-10-CM | POA: Diagnosis not present

## 2023-09-20 DIAGNOSIS — E860 Dehydration: Secondary | ICD-10-CM

## 2023-09-20 DIAGNOSIS — O99281 Endocrine, nutritional and metabolic diseases complicating pregnancy, first trimester: Secondary | ICD-10-CM

## 2023-09-20 DIAGNOSIS — N3001 Acute cystitis with hematuria: Secondary | ICD-10-CM | POA: Diagnosis not present

## 2023-09-20 DIAGNOSIS — Z332 Encounter for elective termination of pregnancy: Secondary | ICD-10-CM

## 2023-09-20 LAB — COMPREHENSIVE METABOLIC PANEL WITH GFR
ALT: 32 U/L (ref 0–44)
AST: 44 U/L — ABNORMAL HIGH (ref 15–41)
Albumin: 3.3 g/dL — ABNORMAL LOW (ref 3.5–5.0)
Alkaline Phosphatase: 57 U/L (ref 38–126)
Anion gap: 10 (ref 5–15)
BUN: <5 mg/dL — ABNORMAL LOW (ref 6–20)
CO2: 23 mmol/L (ref 22–32)
Calcium: 8.9 mg/dL (ref 8.9–10.3)
Chloride: 103 mmol/L (ref 98–111)
Creatinine, Ser: 0.69 mg/dL (ref 0.44–1.00)
GFR, Estimated: >60 mL/min (ref >60)
Glucose, Bld: 102 mg/dL — ABNORMAL HIGH (ref 70–99)
Potassium: 3.6 mmol/L (ref 3.5–5.1)
Sodium: 136 mmol/L (ref 135–145)
Total Bilirubin: 0.4 mg/dL (ref 0.0–1.2)
Total Protein: 6.4 g/dL — ABNORMAL LOW (ref 6.5–8.1)

## 2023-09-20 LAB — URINALYSIS, ROUTINE W REFLEX MICROSCOPIC
Bilirubin Urine: NEGATIVE
Glucose, UA: NEGATIVE mg/dL
Ketones, ur: 5 mg/dL — AB
Nitrite: NEGATIVE
Protein, ur: 100 mg/dL — AB
RBC / HPF: >50 RBC/hpf (ref 0–5)
Specific Gravity, Urine: 1.029 (ref 1.005–1.030)
WBC, UA: >50 WBC/hpf (ref 0–5)
pH: 5 (ref 5.0–8.0)

## 2023-09-20 LAB — CBC
HCT: 27.7 % — ABNORMAL LOW (ref 36.0–46.0)
Hemoglobin: 9.1 g/dL — ABNORMAL LOW (ref 12.0–15.0)
MCH: 25.4 pg — ABNORMAL LOW (ref 26.0–34.0)
MCHC: 32.9 g/dL (ref 30.0–36.0)
MCV: 77.4 fL — ABNORMAL LOW (ref 80.0–100.0)
Platelets: 220 10*3/uL (ref 150–400)
RBC: 3.58 MIL/uL — ABNORMAL LOW (ref 3.87–5.11)
RDW: 20.4 % — ABNORMAL HIGH (ref 11.5–15.5)
WBC: 8 10*3/uL (ref 4.0–10.5)
nRBC: 0 % (ref 0.0–0.2)

## 2023-09-20 LAB — MAGNESIUM: Magnesium: 1.7 mg/dL (ref 1.7–2.4)

## 2023-09-20 LAB — POCT PREGNANCY, URINE: Preg Test, Ur: POSITIVE — AB

## 2023-09-20 MED ORDER — LACTATED RINGERS IV BOLUS
1000.0000 mL | Freq: Once | INTRAVENOUS | Status: AC
  Administered 2023-09-20: 1000 mL via INTRAVENOUS

## 2023-09-20 MED ORDER — ACETAMINOPHEN 500 MG PO TABS
1000.0000 mg | ORAL_TABLET | Freq: Once | ORAL | Status: AC
  Administered 2023-09-20: 1000 mg via ORAL
  Filled 2023-09-20: qty 2

## 2023-09-20 MED ORDER — HYDROMORPHONE HCL 1 MG/ML IJ SOLN
1.0000 mg | Freq: Once | INTRAMUSCULAR | Status: AC
  Administered 2023-09-20: 1 mg via INTRAVENOUS
  Filled 2023-09-20: qty 1

## 2023-09-20 MED ORDER — OXYCODONE HCL 5 MG PO TABS: 10.0000 mg | ORAL_TABLET | Freq: Four times a day (QID) | ORAL | 0 refills | Status: AC | PRN

## 2023-09-20 MED ORDER — ONDANSETRON HCL 4 MG/2ML IJ SOLN
4.0000 mg | Freq: Once | INTRAMUSCULAR | Status: AC
  Administered 2023-09-20: 4 mg via INTRAVENOUS
  Filled 2023-09-20: qty 2

## 2023-09-20 MED ORDER — NITROFURANTOIN MONOHYD MACRO 100 MG PO CAPS: 100.0000 mg | ORAL_CAPSULE | Freq: Two times a day (BID) | ORAL | 0 refills | Status: DC

## 2023-09-20 MED ORDER — ONDANSETRON HCL 4 MG PO TABS: 8.0000 mg | ORAL_TABLET | Freq: Two times a day (BID) | ORAL | 0 refills | Status: AC

## 2023-09-20 NOTE — MAU Note (Signed)
.  Kayla Baird is a 33 y.o. at Unknown here in MAU reporting: Vaginal bleeding, body aches, chills, and HA. She reports she took Abortion pills at home on 3/28. She reports this past Sunday is when she began to feel bad after the bleeding had started. She reports she is unsure if she had a fever at home. She reports yesterday she wore a depends instead of a maxi pad. Wearing a depends today. Denies abdominal pain.  She reports she was [redacted] weeks pregnant when she took the pills.  Last took Tylenol this morning around 0745.  LMP: 07/10/2023 Onset of complaint: This past Sunday  Pain scores:  6/10 body aches 8/10 HA  Vitals:   09/20/23 1207  BP: 118/66  Pulse: (!) 107  Resp: 16  Temp: 99 F (37.2 C)  SpO2: 100%     FHT: n/a Lab orders placed from triage: POCT Preg
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
## 2023-09-21 ENCOUNTER — Inpatient Hospital Stay (HOSPITAL_COMMUNITY)

## 2023-09-21 ENCOUNTER — Observation Stay (HOSPITAL_COMMUNITY)
Admission: AD | Admit: 2023-09-21 | Discharge: 2023-09-22 | Disposition: A | Discharge status: Home / Self Care | Payer: Self-pay | Attending: Obstetrics & Gynecology | Admitting: Obstetrics & Gynecology

## 2023-09-21 ENCOUNTER — Inpatient Hospital Stay (HOSPITAL_BASED_OUTPATIENT_CLINIC_OR_DEPARTMENT_OTHER): Admitting: Anesthesiology

## 2023-09-21 ENCOUNTER — Inpatient Hospital Stay (HOSPITAL_COMMUNITY): Admitting: Anesthesiology

## 2023-09-21 ENCOUNTER — Encounter (HOSPITAL_COMMUNITY)
Admission: AD | Disposition: A | Discharge status: Home / Self Care | Payer: Self-pay | Source: Home / Self Care | Attending: Obstetrics & Gynecology

## 2023-09-21 ENCOUNTER — Other Ambulatory Visit: Payer: Self-pay

## 2023-09-21 ENCOUNTER — Encounter (HOSPITAL_COMMUNITY): Payer: Self-pay | Admitting: Obstetrics and Gynecology

## 2023-09-21 DIAGNOSIS — O021 Missed abortion: Secondary | ICD-10-CM | POA: Diagnosis not present

## 2023-09-21 DIAGNOSIS — N939 Abnormal uterine and vaginal bleeding, unspecified: Secondary | ICD-10-CM

## 2023-09-21 DIAGNOSIS — D649 Anemia, unspecified: Secondary | ICD-10-CM | POA: Insufficient documentation

## 2023-09-21 DIAGNOSIS — F17210 Nicotine dependence, cigarettes, uncomplicated: Secondary | ICD-10-CM | POA: Insufficient documentation

## 2023-09-21 DIAGNOSIS — Z3A10 10 weeks gestation of pregnancy: Secondary | ICD-10-CM

## 2023-09-21 DIAGNOSIS — R112 Nausea with vomiting, unspecified: Secondary | ICD-10-CM

## 2023-09-21 DIAGNOSIS — O034 Incomplete spontaneous abortion without complication: Secondary | ICD-10-CM | POA: Diagnosis present

## 2023-09-21 DIAGNOSIS — O039 Complete or unspecified spontaneous abortion without complication: Principal | ICD-10-CM

## 2023-09-21 DIAGNOSIS — Z3A00 Weeks of gestation of pregnancy not specified: Secondary | ICD-10-CM

## 2023-09-21 DIAGNOSIS — O0739 Failed attempted termination of pregnancy with other complications: Secondary | ICD-10-CM

## 2023-09-21 DIAGNOSIS — D62 Acute posthemorrhagic anemia: Secondary | ICD-10-CM | POA: Diagnosis present

## 2023-09-21 DIAGNOSIS — R519 Headache, unspecified: Secondary | ICD-10-CM

## 2023-09-21 HISTORY — DX: Urinary tract infection, site not specified: N39.0

## 2023-09-21 HISTORY — DX: Benign neoplasm of connective and other soft tissue, unspecified: D21.9

## 2023-09-21 HISTORY — PX: DILATION AND EVACUATION: SHX1459

## 2023-09-21 LAB — CULTURE, OB URINE

## 2023-09-21 LAB — CBC WITH DIFFERENTIAL/PLATELET
Abs Immature Granulocytes: 0.05 10*3/uL (ref 0.00–0.07)
Basophils Absolute: 0 10*3/uL (ref 0.0–0.1)
Basophils Relative: 0 %
Eosinophils Absolute: 0 10*3/uL (ref 0.0–0.5)
Eosinophils Relative: 0 %
HCT: 22.5 % — ABNORMAL LOW (ref 36.0–46.0)
Hemoglobin: 7.4 g/dL — ABNORMAL LOW (ref 12.0–15.0)
Immature Granulocytes: 1 %
Lymphocytes Relative: 16 %
Lymphs Abs: 1.1 10*3/uL (ref 0.7–4.0)
MCH: 24.8 pg — ABNORMAL LOW (ref 26.0–34.0)
MCHC: 32.9 g/dL (ref 30.0–36.0)
MCV: 75.5 fL — ABNORMAL LOW (ref 80.0–100.0)
Monocytes Absolute: 0.9 10*3/uL (ref 0.1–1.0)
Monocytes Relative: 13 %
Neutro Abs: 4.7 10*3/uL (ref 1.7–7.7)
Neutrophils Relative %: 70 %
Platelets: 181 10*3/uL (ref 150–400)
RBC: 2.98 MIL/uL — ABNORMAL LOW (ref 3.87–5.11)
RDW: 19.9 % — ABNORMAL HIGH (ref 11.5–15.5)
WBC: 6.8 10*3/uL (ref 4.0–10.5)
nRBC: 0 % (ref 0.0–0.2)

## 2023-09-21 LAB — COMPREHENSIVE METABOLIC PANEL WITH GFR
ALT: 49 U/L — ABNORMAL HIGH (ref 0–44)
AST: 52 U/L — ABNORMAL HIGH (ref 15–41)
Albumin: 2.7 g/dL — ABNORMAL LOW (ref 3.5–5.0)
Alkaline Phosphatase: 107 U/L (ref 38–126)
Anion gap: 9 (ref 5–15)
BUN: <5 mg/dL — ABNORMAL LOW (ref 6–20)
CO2: 25 mmol/L (ref 22–32)
Calcium: 8.6 mg/dL — ABNORMAL LOW (ref 8.9–10.3)
Chloride: 103 mmol/L (ref 98–111)
Creatinine, Ser: 0.58 mg/dL (ref 0.44–1.00)
GFR, Estimated: >60 mL/min (ref >60)
Glucose, Bld: 119 mg/dL — ABNORMAL HIGH (ref 70–99)
Potassium: 3.9 mmol/L (ref 3.5–5.1)
Sodium: 137 mmol/L (ref 135–145)
Total Bilirubin: 0.2 mg/dL (ref 0.0–1.2)
Total Protein: 5.9 g/dL — ABNORMAL LOW (ref 6.5–8.1)

## 2023-09-21 LAB — PREPARE RBC (CROSSMATCH)

## 2023-09-21 LAB — HCG, QUANTITATIVE, PREGNANCY: hCG, Beta Chain, Quant, S: 5574 m[IU]/mL — ABNORMAL HIGH (ref <5)

## 2023-09-21 SURGERY — DILATION AND EVACUATION, UTERUS
Anesthesia: General | Site: Vagina

## 2023-09-21 MED ORDER — 0.9 % SODIUM CHLORIDE (POUR BTL) OPTIME
TOPICAL | Status: DC | PRN
  Administered 2023-09-21: 1000 mL

## 2023-09-21 MED ORDER — ACETAMINOPHEN-CAFFEINE 500-65 MG PO TABS
2.0000 | ORAL_TABLET | Freq: Once | ORAL | Status: AC
  Administered 2023-09-21: 2 via ORAL
  Filled 2023-09-21: qty 2

## 2023-09-21 MED ORDER — SENNOSIDES-DOCUSATE SODIUM 8.6-50 MG PO TABS: 1.0000 | ORAL_TABLET | Freq: Every evening | ORAL | Status: DC | PRN

## 2023-09-21 MED ORDER — MIDAZOLAM HCL 2 MG/2ML IJ SOLN
INTRAMUSCULAR | Status: AC
  Filled 2023-09-21: qty 2

## 2023-09-21 MED ORDER — ROCURONIUM BROMIDE 10 MG/ML (PF) SYRINGE
PREFILLED_SYRINGE | INTRAVENOUS | Status: DC | PRN
  Administered 2023-09-21: 40 mg via INTRAVENOUS

## 2023-09-21 MED ORDER — FENTANYL CITRATE (PF) 250 MCG/5ML IJ SOLN
INTRAMUSCULAR | Status: AC
  Filled 2023-09-21: qty 5

## 2023-09-21 MED ORDER — SUCCINYLCHOLINE CHLORIDE 200 MG/10ML IV SOSY
PREFILLED_SYRINGE | INTRAVENOUS | Status: DC | PRN
  Administered 2023-09-21: 160 mg via INTRAVENOUS

## 2023-09-21 MED ORDER — FENTANYL CITRATE (PF) 100 MCG/2ML IJ SOLN: 25.0000 ug | INTRAMUSCULAR | Status: DC | PRN

## 2023-09-21 MED ORDER — DEXAMETHASONE SODIUM PHOSPHATE 10 MG/ML IJ SOLN
INTRAMUSCULAR | Status: AC
  Filled 2023-09-21: qty 2

## 2023-09-21 MED ORDER — IBUPROFEN 600 MG PO TABS: 600.0000 mg | ORAL_TABLET | Freq: Four times a day (QID) | ORAL | Status: DC

## 2023-09-21 MED ORDER — BUPIVACAINE HCL (PF) 0.5 % IJ SOLN
INTRAMUSCULAR | Status: AC
  Filled 2023-09-21: qty 30

## 2023-09-21 MED ORDER — LACTATED RINGERS IV SOLN: INTRAVENOUS | Status: DC | PRN

## 2023-09-21 MED ORDER — PROPOFOL 10 MG/ML IV BOLUS
INTRAVENOUS | Status: DC | PRN
Start: 2023-09-21 — End: 2023-09-21
  Administered 2023-09-21: 160 mg via INTRAVENOUS

## 2023-09-21 MED ORDER — ZOLPIDEM TARTRATE 5 MG PO TABS: 5.0000 mg | ORAL_TABLET | Freq: Every evening | ORAL | Status: DC | PRN

## 2023-09-21 MED ORDER — METHYLERGONOVINE MALEATE 0.2 MG/ML IJ SOLN
INTRAMUSCULAR | Status: AC
  Filled 2023-09-21: qty 1

## 2023-09-21 MED ORDER — PHENYLEPHRINE 80 MCG/ML (10ML) SYRINGE FOR IV PUSH (FOR BLOOD PRESSURE SUPPORT)
PREFILLED_SYRINGE | INTRAVENOUS | Status: AC
  Filled 2023-09-21: qty 20

## 2023-09-21 MED ORDER — ONDANSETRON HCL 4 MG/2ML IJ SOLN
INTRAMUSCULAR | Status: AC
  Filled 2023-09-21: qty 4

## 2023-09-21 MED ORDER — PROPOFOL 10 MG/ML IV BOLUS
INTRAVENOUS | Status: AC
  Filled 2023-09-21: qty 20

## 2023-09-21 MED ORDER — DOCUSATE SODIUM 100 MG PO CAPS
100.0000 mg | ORAL_CAPSULE | Freq: Two times a day (BID) | ORAL | Status: DC
  Filled 2023-09-21 (×2): qty 1

## 2023-09-21 MED ORDER — KETOROLAC TROMETHAMINE 30 MG/ML IJ SOLN: 30.0000 mg | Freq: Once | INTRAMUSCULAR | Status: DC

## 2023-09-21 MED ORDER — FENTANYL CITRATE (PF) 250 MCG/5ML IJ SOLN
INTRAMUSCULAR | Status: DC | PRN
  Administered 2023-09-21: 50 ug via INTRAVENOUS

## 2023-09-21 MED ORDER — LIDOCAINE 2% (20 MG/ML) 5 ML SYRINGE
INTRAMUSCULAR | Status: AC
  Filled 2023-09-21: qty 15

## 2023-09-21 MED ORDER — SODIUM CHLORIDE 0.9% FLUSH
3.0000 mL | Freq: Two times a day (BID) | INTRAVENOUS | Status: DC
  Administered 2023-09-21: 10 mL via INTRAVENOUS

## 2023-09-21 MED ORDER — ALUM & MAG HYDROXIDE-SIMETH 200-200-20 MG/5ML PO SUSP: 30.0000 mL | ORAL | Status: DC | PRN

## 2023-09-21 MED ORDER — ROCURONIUM BROMIDE 10 MG/ML (PF) SYRINGE
PREFILLED_SYRINGE | INTRAVENOUS | Status: AC
  Filled 2023-09-21: qty 20

## 2023-09-21 MED ORDER — LACTATED RINGERS IV BOLUS
1000.0000 mL | Freq: Once | INTRAVENOUS | Status: AC
  Administered 2023-09-21: 1000 mL via INTRAVENOUS

## 2023-09-21 MED ORDER — EPHEDRINE 5 MG/ML INJ
INTRAVENOUS | Status: AC
Start: 2023-09-21 — End: ?
  Filled 2023-09-21: qty 10

## 2023-09-21 MED ORDER — MAGNESIUM CITRATE PO SOLN: 1.0000 | Freq: Once | ORAL | Status: DC | PRN

## 2023-09-21 MED ORDER — SUCCINYLCHOLINE CHLORIDE 200 MG/10ML IV SOSY
PREFILLED_SYRINGE | INTRAVENOUS | Status: AC
  Filled 2023-09-21: qty 10

## 2023-09-21 MED ORDER — METHYLERGONOVINE MALEATE 0.2 MG/ML IJ SOLN
INTRAMUSCULAR | Status: DC | PRN
  Administered 2023-09-21: .2 mg via INTRAMUSCULAR

## 2023-09-21 MED ORDER — ACETAMINOPHEN 500 MG PO TABS
1000.0000 mg | ORAL_TABLET | Freq: Four times a day (QID) | ORAL | Status: DC
  Administered 2023-09-21 – 2023-09-22 (×2): 1000 mg via ORAL
  Filled 2023-09-21 (×2): qty 2

## 2023-09-21 MED ORDER — MENTHOL 3 MG MT LOZG: 1.0000 | LOZENGE | OROMUCOSAL | Status: DC | PRN

## 2023-09-21 MED ORDER — OXYCODONE HCL 5 MG PO TABS: 5.0000 mg | ORAL_TABLET | ORAL | Status: DC | PRN

## 2023-09-21 MED ORDER — LACTATED RINGERS IV SOLN: INTRAVENOUS | Status: DC

## 2023-09-21 MED ORDER — ONDANSETRON HCL 4 MG/2ML IJ SOLN
INTRAMUSCULAR | Status: DC | PRN
  Administered 2023-09-21: 4 mg via INTRAVENOUS

## 2023-09-21 MED ORDER — CHLORHEXIDINE GLUCONATE 0.12 % MT SOLN: 15.0000 mL | Freq: Once | OROMUCOSAL | Status: AC

## 2023-09-21 MED ORDER — SUGAMMADEX SODIUM 200 MG/2ML IV SOLN
INTRAVENOUS | Status: DC | PRN
  Administered 2023-09-21 (×2): 100 mg via INTRAVENOUS

## 2023-09-21 MED ORDER — OXYCODONE HCL 5 MG PO TABS: 5.0000 mg | ORAL_TABLET | Freq: Once | ORAL | Status: DC | PRN

## 2023-09-21 MED ORDER — DOXYCYCLINE HYCLATE 100 MG IV SOLR
200.0000 mg | Freq: Once | INTRAVENOUS | Status: AC
  Administered 2023-09-21: 200 mg via INTRAVENOUS
  Filled 2023-09-21: qty 200

## 2023-09-21 MED ORDER — SODIUM CHLORIDE 0.9% IV SOLUTION: Freq: Once | INTRAVENOUS | Status: DC

## 2023-09-21 MED ORDER — PHENYLEPHRINE 80 MCG/ML (10ML) SYRINGE FOR IV PUSH (FOR BLOOD PRESSURE SUPPORT)
PREFILLED_SYRINGE | INTRAVENOUS | Status: DC | PRN
  Administered 2023-09-21: 80 ug via INTRAVENOUS

## 2023-09-21 MED ORDER — DEXAMETHASONE SODIUM PHOSPHATE 10 MG/ML IJ SOLN
INTRAMUSCULAR | Status: DC | PRN
  Administered 2023-09-21: 10 mg via INTRAVENOUS

## 2023-09-21 MED ORDER — ONDANSETRON HCL 4 MG PO TABS: 4.0000 mg | ORAL_TABLET | Freq: Four times a day (QID) | ORAL | Status: DC | PRN

## 2023-09-21 MED ORDER — ONDANSETRON HCL 4 MG/2ML IJ SOLN
4.0000 mg | Freq: Once | INTRAMUSCULAR | Status: AC
  Administered 2023-09-21: 4 mg via INTRAVENOUS
  Filled 2023-09-21: qty 2

## 2023-09-21 MED ORDER — IBUPROFEN 800 MG PO TABS
400.0000 mg | ORAL_TABLET | Freq: Once | ORAL | Status: AC
  Administered 2023-09-21: 400 mg via ORAL
  Filled 2023-09-21: qty 1

## 2023-09-21 MED ORDER — ORAL CARE MOUTH RINSE: 15.0000 mL | Freq: Once | OROMUCOSAL | Status: AC

## 2023-09-21 MED ORDER — LIDOCAINE 2% (20 MG/ML) 5 ML SYRINGE
INTRAMUSCULAR | Status: DC | PRN
  Administered 2023-09-21: 70 mg via INTRAVENOUS

## 2023-09-21 MED ORDER — SIMETHICONE 80 MG PO CHEW: 80.0000 mg | CHEWABLE_TABLET | Freq: Four times a day (QID) | ORAL | Status: DC | PRN

## 2023-09-21 MED ORDER — SODIUM CHLORIDE 0.9 % IV SOLN
INTRAVENOUS | Status: DC | PRN
Start: 2023-09-21 — End: 2023-09-21

## 2023-09-21 MED ORDER — KETOROLAC TROMETHAMINE 30 MG/ML IJ SOLN
30.0000 mg | Freq: Four times a day (QID) | INTRAMUSCULAR | Status: DC
  Administered 2023-09-21 – 2023-09-22 (×2): 30 mg via INTRAVENOUS
  Filled 2023-09-21 (×2): qty 1

## 2023-09-21 MED ORDER — HYDROMORPHONE HCL 2 MG PO TABS: 2.0000 mg | ORAL_TABLET | ORAL | Status: DC | PRN

## 2023-09-21 MED ORDER — MIDAZOLAM HCL 2 MG/2ML IJ SOLN
INTRAMUSCULAR | Status: DC | PRN
  Administered 2023-09-21: 2 mg via INTRAVENOUS

## 2023-09-21 MED ORDER — OXYCODONE HCL 5 MG/5ML PO SOLN: 5.0000 mg | Freq: Once | ORAL | Status: DC | PRN

## 2023-09-21 MED ORDER — BISACODYL 10 MG RE SUPP: 10.0000 mg | Freq: Every day | RECTAL | Status: DC | PRN

## 2023-09-21 MED ORDER — ACETAMINOPHEN 10 MG/ML IV SOLN: 1000.0000 mg | Freq: Once | INTRAVENOUS | Status: DC | PRN

## 2023-09-21 MED ORDER — CHLORHEXIDINE GLUCONATE 0.12 % MT SOLN
OROMUCOSAL | Status: AC
  Administered 2023-09-21: 15 mL via OROMUCOSAL
  Filled 2023-09-21: qty 15

## 2023-09-21 MED ORDER — ONDANSETRON HCL 4 MG/2ML IJ SOLN: 4.0000 mg | Freq: Four times a day (QID) | INTRAMUSCULAR | Status: DC | PRN

## 2023-09-21 MED ORDER — PANTOPRAZOLE SODIUM 40 MG PO TBEC
40.0000 mg | DELAYED_RELEASE_TABLET | Freq: Every day | ORAL | Status: DC
  Administered 2023-09-21 – 2023-09-22 (×2): 40 mg via ORAL
  Filled 2023-09-21 (×2): qty 1

## 2023-09-21 MED ORDER — SODIUM CHLORIDE 0.9% FLUSH: 3.0000 mL | INTRAVENOUS | Status: DC | PRN

## 2023-09-21 SURGICAL SUPPLY — 21 items
CATH ROBINSON RED A/P 16FR (CATHETERS) ×1 IMPLANT
FILTER UTR ASPR ASSEMBLY (MISCELLANEOUS) ×1 IMPLANT
GLOVE BIOGEL PI IND STRL 7.0 (GLOVE) ×1 IMPLANT
GLOVE ECLIPSE 7.0 STRL STRAW (GLOVE) ×1 IMPLANT
GOWN STRL REUS W/ TWL LRG LVL3 (GOWN DISPOSABLE) ×2 IMPLANT
HOSE CONNECTING 18IN BERKELEY (TUBING) ×1 IMPLANT
KIT BERKELEY 1ST TRI 3/8 NO TR (MISCELLANEOUS) ×1 IMPLANT
KIT BERKELEY 1ST TRIMESTER 3/8 (MISCELLANEOUS) ×1 IMPLANT
NS IRRIG 1000ML POUR BTL (IV SOLUTION) ×1 IMPLANT
PACK VAGINAL MINOR WOMEN LF (CUSTOM PROCEDURE TRAY) ×1 IMPLANT
PAD OB MATERNITY 11 LF (PERSONAL CARE ITEMS) ×1 IMPLANT
SET BERKELEY SUCTION TUBING (SUCTIONS) ×1 IMPLANT
SPIKE FLUID TRANSFER (MISCELLANEOUS) ×1 IMPLANT
TOWEL GREEN STERILE FF (TOWEL DISPOSABLE) ×2 IMPLANT
UNDERPAD 30X36 HEAVY ABSORB (UNDERPADS AND DIAPERS) ×1 IMPLANT
VACURETTE 10 RIGID CVD (CANNULA) IMPLANT
VACURETTE 6 ASPIR F TIP BERK (CANNULA) IMPLANT
VACURETTE 7MM CVD STRL WRAP (CANNULA) IMPLANT
VACURETTE 8 RIGID CVD (CANNULA) IMPLANT
VACURETTE 8MM F TIP (MISCELLANEOUS) IMPLANT
VACURETTE 9 RIGID CVD (CANNULA) IMPLANT

## 2023-09-21 NOTE — H&P (Signed)
 Preoperative History and Physical  WILMA MICHAELSON is a 33 y.o. [id number] here for surgical management of retained products of conception following recent misoprostol termination of pregnancy.  Took Misoprostol 800 mcg on 09/09/23 and 09/10/23, had bleeding afterwards but has continued to have heavy bleeding. Associated with nausea and vomiting.  Was evaluated in MAU on 09/21/23, vitals were stable but and hemoglobin was 9.1 . She was sent home with antiemetics and pain medications.  She returned to MAU today with continued heavy bleeding, hemoglobin was noted to 7.4 and she reports having fevers and chills at home. Given concern about retained products, possible brewing infection (afebrile here, with normal WBC), the decision was made to proceed with Dilation and Evacuation.  No significant preoperative concerns.  Proposed surgery: Dilation and Evacuation under ultrasound guidance  Past Medical History:  Diagnosis Date   Chlamydia    Fibroids    UTI (urinary tract infection)    Yeast infection    Past Surgical History:  Procedure Laterality Date   EYE SURGERY     OB History  Gravida Para Term Preterm AB Living  5 2 2  2 2   SAB IAB Ectopic Multiple Live Births   2   2    # Outcome Date GA Lbr Len/2nd Weight Sex Type Anes PTL Lv  5 Current           4 IAB 2019          3 IAB 2015          2 Term 09/02/12 [redacted]w[redacted]d 01:19 / 00:29 2800 g F Vag-Spont None  LIV  1 Term 2009    M Vag-Spont EPI  LIV    Obstetric Comments  All AB's have been with medication  Patient denies any other pertinent gynecologic issues.   No current facility-administered medications on file prior to encounter.   Current Outpatient Medications on File Prior to Encounter  Medication Sig Dispense Refill   nitrofurantoin, macrocrystal-monohydrate, (MACROBID) 100 MG capsule Take 1 capsule (100 mg total) by mouth 2 (two) times daily. 10 capsule 0   oxyCODONE (ROXICODONE) 5 MG immediate release tablet Take 2 tablets (10  mg total) by mouth every 6 (six) hours as needed for up to 5 days for severe pain (pain score 7-10). 30 tablet 0   ondansetron (ZOFRAN) 4 MG tablet Take 2 tablets (8 mg total) by mouth 2 (two) times daily. 20 tablet 0   No Known Allergies  Social History:   reports that she has been smoking cigarettes. She has never used smokeless tobacco. She reports current alcohol use. She reports that she does not currently use drugs after having used the following drugs: Marijuana.  Family History  Problem Relation Age of Onset   Hypertension Mother    Healthy Father    Other Neg Hx     Review of Systems: Pertinent items noted in HPI and remainder of comprehensive ROS otherwise negative.  PHYSICAL EXAM: Blood pressure 120/68, pulse 78, temperature 98 F (36.7 C), temperature source Oral, resp. rate 16, height 5\' 7"  (1.702 m), weight 87.9 kg, last menstrual period 07/10/2023, SpO2 100%, unknown if currently breastfeeding. CONSTITUTIONAL: Well-developed, female in no acute distress.  NECK: Normal range of motion, supple, no masses SKIN: Skin is warm and dry. No rash noted. Not diaphoretic. No erythema. No pallor. NEUROLOGIC: Alert and oriented to person, place, and time. Normal reflexes, muscle tone coordination. No cranial nerve deficit noted. PSYCHIATRIC: Normal mood and affect. Normal  behavior. Normal judgment and thought content. CARDIOVASCULAR: Normal heart rate noted, regular rhythm RESPIRATORY: Effort and breath sounds normal, no problems with respiration noted ABDOMEN: Soft, nontender, nondistended. PELVIC: Deferred MUSCULOSKELETAL: Normal range of motion. No edema and no tenderness. 2+ distal pulses.  Labs: Results for orders placed or performed during the hospital encounter of 09/21/23 (from the past 2 weeks)  CBC with Differential/Platelet   Collection Time: 09/21/23  3:23 PM  Result Value Ref Range   WBC 6.8 4.0 - 10.5 K/uL   RBC 2.98 (L) 3.87 - 5.11 MIL/uL   Hemoglobin 7.4 (L)  12.0 - 15.0 g/dL   HCT 16.1 (L) 09.6 - 04.5 %   MCV 75.5 (L) 80.0 - 100.0 fL   MCH 24.8 (L) 26.0 - 34.0 pg   MCHC 32.9 30.0 - 36.0 g/dL   RDW 40.9 (H) 81.1 - 91.4 %   Platelets 181 150 - 400 K/uL   nRBC 0.0 0.0 - 0.2 %   Neutrophils Relative % 70 %   Neutro Abs 4.7 1.7 - 7.7 K/uL   Lymphocytes Relative 16 %   Lymphs Abs 1.1 0.7 - 4.0 K/uL   Monocytes Relative 13 %   Monocytes Absolute 0.9 0.1 - 1.0 K/uL   Eosinophils Relative 0 %   Eosinophils Absolute 0.0 0.0 - 0.5 K/uL   Basophils Relative 0 %   Basophils Absolute 0.0 0.0 - 0.1 K/uL   Immature Granulocytes 1 %   Abs Immature Granulocytes 0.05 0.00 - 0.07 K/uL  Comprehensive metabolic panel   Collection Time: 09/21/23  3:23 PM  Result Value Ref Range   Sodium 137 135 - 145 mmol/L   Potassium 3.9 3.5 - 5.1 mmol/L   Chloride 103 98 - 111 mmol/L   CO2 25 22 - 32 mmol/L   Glucose, Bld 119 (H) 70 - 99 mg/dL   BUN <5 (L) 6 - 20 mg/dL   Creatinine, Ser 7.82 0.44 - 1.00 mg/dL   Calcium 8.6 (L) 8.9 - 10.3 mg/dL   Total Protein 5.9 (L) 6.5 - 8.1 g/dL   Albumin 2.7 (L) 3.5 - 5.0 g/dL   AST 52 (H) 15 - 41 U/L   ALT 49 (H) 0 - 44 U/L   Alkaline Phosphatase 107 38 - 126 U/L   Total Bilirubin 0.2 0.0 - 1.2 mg/dL   GFR, Estimated >95 >62 mL/min   Anion gap 9 5 - 15  hCG, quantitative, pregnancy   Collection Time: 09/21/23  3:23 PM  Result Value Ref Range   hCG, Beta Chain, Quant, S 5,574 (H) <5 mIU/mL  Results for orders placed or performed during the hospital encounter of 09/20/23 (from the past 2 weeks)  Pregnancy, urine POC   Collection Time: 09/20/23 12:24 PM  Result Value Ref Range   Preg Test, Ur POSITIVE (A) NEGATIVE  Culture, OB Urine   Collection Time: 09/20/23 12:27 PM   Specimen: Urine, Random  Result Value Ref Range   Specimen Description URINE, RANDOM    Special Requests NONE    Culture (A)     MULTIPLE SPECIES PRESENT, SUGGEST RECOLLECTION NO GROUP B STREP (S.AGALACTIAE) ISOLATED Performed at [facility], [address] [place], [place] [address]    Report Status 09/21/2023 FINAL   Urinalysis, Routine w reflex microscopic -Urine, Clean Catch   Collection Time: 09/20/23 12:27 PM  Result Value Ref Range   Color, Urine AMBER (A) YELLOW   APPearance CLOUDY (A) CLEAR   Specific Gravity, Urine 1.029 1.005 -  1.030   pH 5.0 5.0 - 8.0   Glucose, UA NEGATIVE NEGATIVE mg/dL   Hgb urine dipstick LARGE (A) NEGATIVE   Bilirubin Urine NEGATIVE NEGATIVE   Ketones, ur 5 (A) NEGATIVE mg/dL   Protein, ur 161 (A) NEGATIVE mg/dL   Nitrite NEGATIVE NEGATIVE   Leukocytes,Ua LARGE (A) NEGATIVE   RBC / HPF >50 0 - 5 RBC/hpf   WBC, UA >50 0 - 5 WBC/hpf   Bacteria, UA RARE (A) NONE SEEN   Squamous Epithelial / HPF 11-20 0 - 5 /HPF   WBC Clumps PRESENT    Mucus PRESENT   CBC   Collection Time: 09/20/23  1:36 PM  Result Value Ref Range   WBC 8.0 4.0 - 10.5 K/uL   RBC 3.58 (L) 3.87 - 5.11 MIL/uL   Hemoglobin 9.1 (L) 12.0 - 15.0 g/dL   HCT 09.6 (L) 04.5 - 40.9 %   MCV 77.4 (L) 80.0 - 100.0 fL   MCH 25.4 (L) 26.0 - 34.0 pg   MCHC 32.9 30.0 - 36.0 g/dL   RDW 81.1 (H) 91.4 - 78.2 %   Platelets 220 150 - 400 K/uL   nRBC 0.0 0.0 - 0.2 %  Comprehensive metabolic panel   Collection Time: 09/20/23  1:36 PM  Result Value Ref Range   Sodium 136 135 - 145 mmol/L   Potassium 3.6 3.5 - 5.1 mmol/L   Chloride 103 98 - 111 mmol/L   CO2 23 22 - 32 mmol/L   Glucose, Bld 102 (H) 70 - 99 mg/dL   BUN <5 (L) 6 - 20 mg/dL   Creatinine, Ser 9.56 0.44 - 1.00 mg/dL   Calcium 8.9 8.9 - 21.3 mg/dL   Total Protein 6.4 (L) 6.5 - 8.1 g/dL   Albumin 3.3 (L) 3.5 - 5.0 g/dL   AST 44 (H) 15 - 41 U/L   ALT 32 0 - 44 U/L   Alkaline Phosphatase 57 38 - 126 U/L   Total Bilirubin 0.4 0.0 - 1.2 mg/dL   GFR, Estimated >08 >65 mL/min   Anion gap 10 5 - 15  Magnesium   Collection Time: 09/20/23  1:36 PM  Result Value Ref Range   Magnesium 1.7 1.7 - 2.4 mg/dL    Imaging Studies: US OB LESS THAN 14 WEEKS WITH OB  TRANSVAGINAL Result Date: 09/20/2023 CLINICAL DATA:  Vaginal bleeding. Prior medical abortion September 09, 2023 EXAM: OBSTETRIC <14 WK [place] AND TRANSVAGINAL OB US TECHNIQUE: Both transabdominal and transvaginal ultrasound examinations were performed for complete evaluation of the gestation as well as the maternal uterus, adnexal regions, and pelvic cul-de-sac. Transvaginal technique was performed to assess early pregnancy. COMPARISON:  None Available. FINDINGS: Uterus and endometrium:: Transabdominal images are limited due to overlapping bowel gas and soft tissue and a contracted urinary bladder. Retroverted uterus identified. Heterogeneous myometrium with a focal heterogeneous area posteriorly with shadowing consistent with a 3.2 x 3.1 cm fibroid. Second fibroid measures 13 mm. Third measured at 3.9 cm the endometrial stripe is thickened and heterogeneous with some fluid or cystic areas. There also is some flow in the endometrium on color Doppler. Retained products are possible. Ovaries: Right ovary has small follicles and blood flow on color Doppler measuring 3.3 x 1.9 by 1.5 cm. Similar appearance to the left ovary measuring 2.0 x 1.1 x 1.7 cm. No significant free fluid in the pelvis. IMPRESSION: Thickened heterogeneous endometrium with some fluid as well as some blood flow. With the patient's history is some retained products are  possible. Recommend further evaluation. Retroverted uterus. No significant free fluid in the pelvis.  Multiple uterine fibroids. Limited transabdominal images. Electronically Signed   By: Karen Kays M.D.   On: 09/20/2023 16:34    Assessment: Principal Problem:   Retained products of conception following termination of pregnancy   Plan: Patient will undergo surgical management with Dilation and Evacuation under ultrasound guidance.   The risks of surgery were discussed in detail with the patient including but not limited to:  bleeding, infection, injury to surrounding organs, need  for additional procedures, possibility of intrauterine scarring which may impair future fertility, risk of retained products which may require further management and other postoperative/anesthesia complications were explained to patient.  Likelihood of success of complete evacuation of the uterus was discussed with the patient.  Written informed consent was obtained.  Patient has been NPO since this morning,  she will remain NPO for procedure. Anesthesia and OR aware.  Preoperative prophylactic Doxycycline 200mg  IV  has been ordered and is on call to the OR.  To OR when ready.    Jaynie Collins, MD, FACOG Obstetrician & Gynecologist, [facility] for [employer], [facility]

## 2023-09-21 NOTE — Anesthesia Postprocedure Evaluation (Signed)
 Anesthesia Post Note  Patient: Kayla Baird  Procedure(s) Performed: DILATION AND EVACUATION, UTERUS (Vagina )     Patient location during evaluation: PACU Anesthesia Type: General Level of consciousness: awake and alert Pain management: pain level controlled Vital Signs Assessment: post-procedure vital signs reviewed and stable Respiratory status: spontaneous breathing, nonlabored ventilation and respiratory function stable Cardiovascular status: blood pressure returned to baseline and stable Postop Assessment: no apparent nausea or vomiting Anesthetic complications: no   No notable events documented.  Last Vitals:  Vitals:   09/21/23 2025 09/21/23 2030  BP: (!) 102/55 (!) 106/53  Pulse: 76 81  Resp: 13 12  Temp: 36.6 C   SpO2: 99% 100%    Last Pain:  Vitals:   09/21/23 2030  TempSrc:   PainSc: 0-No pain                 Morgen Linebaugh

## 2023-09-21 NOTE — MAU Note (Signed)
 Ongoing/worsening headaches.  Denies hx of migraines.  Vomiting started this morning, this was a new problems.  She thinks it was from the medications she was prescribed yesterday.  Hasn't eaten since Sun. Tried to eat a little something last night when she took the meds.

## 2023-09-21 NOTE — Op Note (Signed)
 Kayla Baird PROCEDURE DATE:  09/21/2023  PREOPERATIVE DIAGNOSES: Retained products of conception after misoprostol induced termination of pregnancy, acute blood loss anemia POSTOPERATIVE DIAGNOSES: The same PROCEDURE:  Dilation and Evacuation under ultrasound guidance SURGEON:  Dr. Jaynie Collins  INDICATIONS: 33 y.o.  [id number] with retained products of conception after misoprostol induced termination of pregnancy and acute blood loss anemia, who needs surgical management.  Risks of surgery were discussed with the patient including but not limited to: bleeding which may require transfusion; infection which may require antibiotics; injury to uterus or surrounding organs; need for additional procedures including laparotomy or laparoscopy; possibility of intrauterine scarring which may impair future fertility; and other postoperative/anesthesia complications. Written informed consent was obtained.  FINDINGS:   Significant amount of products of conception.  Empty endometrial stripe noted on ultrasound at the end of the procedure.  Patient received blood transfusion in the OR due to being very hypotensive as a result of her acute blood loss anemia. Two pRBCs were ordered, one unit was transfusing during the case with the plan to transfuse the second one afterwards.  ANESTHESIA:    General INTRAVENOUS FLUIDS:  700 ml of LR, 1 pBRCs (another unit will be given in PACU) ESTIMATED BLOOD LOSS:  200 ml. SPECIMENS:  Products of conception sent to pathology COMPLICATIONS:  None immediate.  PROCEDURE DETAILS:  The patient received intravenous Doxycycline while in the preoperative area.  She was then taken to the operating room where general anesthesia was administered and was found to be adequate.  After an adequate timeout was performed, she was placed in the dorsal lithotomy position and examined; then prepped and draped in the sterile manner.   Her bladder was catheterized for an unmeasured amount of clear,  yellow urine. A vaginal speculum was then placed in the patient's vagina and a single tooth tenaculum was applied to the anterior lip of the cervix.  A paracervical block using 30 ml of 0.5% Marcaine was administered. The cervix was gently dilated under ultrasound guidance to accommodate a 8 mm suction curette that was gently advanced to the uterine fundus.  The suction device was then activated and curette slowly rotated to clear the uterus of products of conception.  A sharp curettage was then performed to confirm complete emptying of the uterus. There was an empty endometrial stripe noted on the ultrasound at the end of the curettage. Methergine 0.2 mg IM was administered to help prevent excessive bleeding. There was minimal bleeding noted at the end of the procedure, and the tenaculum removed with good hemostasis noted.   All instruments were removed from the patient's vagina.  Sponge and instrument counts were correct times three.    The patient tolerated the procedure well and was taken to the recovery area awake, extubated and in stable condition.  The patient will be observed overnight given her amount of blood loss and ongoing transfusions. Routine postoperative orders placed.   Jaynie Collins, MD, FACOG Obstetrician & Gynecologist, [facility] for [employer], [facility]

## 2023-09-21 NOTE — MAU Note (Signed)
 Kayla Baird is a 33 y.o. at [redacted]w[redacted]d here in MAU reporting: she has had a HA since yesterday, reports took meds but unable to keep it down.  Also c/o N/V, states hasn't eaten solids since Monday, but keeping some fluids down.  Denies abdominal pain/cramping, endorses moderate VB. States was seen in MAU yesterday and had a HA upon discharge   LMP: 07/10/2023 Onset of complaint: yesterday Pain score: 10 Vitals:   09/21/23 1353  BP: 110/63  Pulse: 87  Resp: 18  Temp: 97.9 F (36.6 C)  SpO2: 98%     FHT: NA  Lab orders placed from triage: UA

## 2023-09-21 NOTE — Transfer of Care (Signed)
 Immediate Anesthesia Transfer of Care Note  Patient: Kayla Baird  Procedure(s) Performed: DILATION AND EVACUATION, UTERUS (Vagina )  Patient Location: PACU  Anesthesia Type:General  Level of Consciousness: awake, alert , and oriented  Airway & Oxygen Therapy: Patient Spontanous Breathing  Post-op Assessment: Report given to RN and Post -op Vital signs reviewed and stable  Post vital signs: Reviewed and stable  Last Vitals:  Vitals Value Taken Time  BP 108/68 09/21/23 1934  Temp    Pulse 95 09/21/23 1939  Resp 19 09/21/23 1940  SpO2 98 % 09/21/23 1939  Vitals shown include unfiled device data.  Last Pain:  Vitals:   09/21/23 1804  TempSrc: Oral  PainSc: 0-No pain         Complications: No notable events documented.

## 2023-09-21 NOTE — Anesthesia Procedure Notes (Signed)
 Procedure Name: Intubation Date/Time: 09/21/2023 6:52 PM  Performed by: Laruth Bouchard., CRNAPre-anesthesia Checklist: Patient identified, Emergency Drugs available, Suction available, Patient being monitored and Timeout performed Patient Re-evaluated:Patient Re-evaluated prior to induction Oxygen Delivery Method: Circle system utilized Preoxygenation: Pre-oxygenation with 100% oxygen Induction Type: IV induction and Rapid sequence Laryngoscope Size: Mac and 3 Grade View: Grade I Tube type: Oral Tube size: 7.0 mm Number of attempts: 1 Airway Equipment and Method: Stylet Placement Confirmation: ETT inserted through vocal cords under direct vision, positive ETCO2 and breath sounds checked- equal and bilateral Secured at: 23 cm Tube secured with: Tape Dental Injury: Teeth and Oropharynx as per pre-operative assessment

## 2023-09-21 NOTE — MAU Provider Note (Signed)
 None     S [name] is a 33 y.o. [phone] post-miscarriage female at [redacted]w[redacted]d who presents to MAU today with complaint of HA since 09/20/23, NV, unable to keep anything down since Monday and vaginal bleeding, ongoing since 09/10/23. She was seen in MAU 09/20/23 and was prescribed antiemetic and pain medication. She reports she took abortion pills (800 mcg cytotec) 09/09/23, with repeat dose 09/10/23 and began bleeding over that weekend. She reports she has been bleeding heavily since, with ping-pong sized clots passed intermittently, though most mornings.    She reports that she was able to pick up prescriptions from pharmacy, however, she reports she initially took her pain medication before antiemetic and subsequently vomited the medications up. Then she was afraid to take any further medications.  Reports no solid intake since Sunday or Monday. Only water with medications today.   Has not established prenatal care.   Pertinent items noted in HPI and remainder of comprehensive ROS otherwise negative.   O BP 110/63 (BP Location: Right Arm)   Pulse 87   Temp 97.9 F (36.6 C) (Oral)   Resp 18   Ht 5\' 7"  (1.702 m)   Wt 87.9 kg   LMP 07/10/2023 (Exact Date)   SpO2 98%   BMI 30.35 kg/m  Physical Exam Vitals reviewed.  Constitutional:      General: She is not in acute distress.    Appearance: Normal appearance. She is ill-appearing. She is not toxic-appearing or diaphoretic.  HENT:     Head: Normocephalic.  Cardiovascular:     Rate and Rhythm: Normal rate.     Pulses: Normal pulses.     Heart sounds: Normal heart sounds.  Pulmonary:     Effort: Pulmonary effort is normal.  Abdominal:     General: Bowel sounds are normal.     Palpations: Abdomen is soft.  Genitourinary:    Comments: Deferred by patient. Patient demonstrated moderate bleeding on chart in room.   Skin:    General: Skin is warm and dry.     Capillary Refill: Capillary refill takes less than 2 seconds.   Neurological:     Mental Status: She is alert and oriented to person, place, and time.  Psychiatric:        Mood and Affect: Mood normal.        Speech: Speech normal.        Behavior: Behavior normal.      MDM: Ddx includes complications from IAB or septic AB.  Moderate, review of chart, independent review of ultrasound report and images.  Obtain labs to trend: CMP, CBC and initial HcG.  Order antiemetic, IV fluid bolus and pain medication.  CBC returned with significant drop in Hgb in the past 24 hours from 9.1 to 7.4.  Consulted Dr. Berton Lan and given clinical picture, labs and time elapsed, she recommends D&E here.   MAU Course:  A Miscarriage - Review of medical records and patient history - Induced AB - Vitals stable - Clinical picture concerning for complications from AB including retained products, anemia with Hgb drop from 9.1 to 7.4 in approximately 24 hours and consideration for septic abortion.   Vaginal bleeding - Reports it has been constant since 800 mcg of cytotec 09/09/23 - Clinically significant drop in hemoglobin from 9.1 to 7.4 in approximately 24 hours  Acute nonintractable headache, unspecified headache type - Pain medication Excedrin Tension 1000 mg acetaminophen with 130 mg caffeine ordered and administered with relief - Pain medication  800 mg ibuprofen given with relief  Nausea and vomiting, unspecified vomiting type  - Antiemetic (zofran 4mg ) given IV with good result.  Medical screening exam complete  P Obtain labs to trend: CMP, CBC and initial HcG.  Order antiemetic, IV fluid bolus and pain medication.  - Antiemetic (zofran 4mg ) given IV with good result. - Pain medication Excedrin Tension 1000 mg acetaminophen with 130 mg caffeine ordered and administered with relief - Pain medication 800 mg ibuprofen given with relief CBC returned with significant drop in Hgb in the past 24 hours from 9.1 to 7.4.  Consulted Dr. Berton Lan and given clinical  picture, labs and time elapsed, she recommends D&E here.  Care turned over to Dr. Berton Lan for surgical management.    Lamont Snowball, MSN, CNM 09/21/2023 4:56 PM  Certified Nurse Midwife, [facility]

## 2023-09-21 NOTE — Anesthesia Preprocedure Evaluation (Addendum)
 Anesthesia Evaluation  Patient identified by MRN, date of birth, ID band Patient awake    Reviewed: Allergy & Precautions, NPO status , Patient's Chart, lab work & pertinent test results  History of Anesthesia Complications Negative for: history of anesthetic complications  Airway Mallampati: II  TM Distance: >3 FB Neck ROM: Full    Dental  (+) Dental Advisory Given, Teeth Intact   Pulmonary neg shortness of breath, neg sleep apnea, neg COPD, neg recent URI, Current Smoker and Patient abstained from smoking.   breath sounds clear to auscultation       Cardiovascular negative cardio ROS  Rhythm:Regular     Neuro/Psych negative neurological ROS  negative psych ROS   GI/Hepatic negative GI ROS, Neg liver ROS,,,  Endo/Other  negative endocrine ROS    Renal/GU negative Renal ROS     Musculoskeletal   Abdominal   Peds  Hematology  (+) Blood dyscrasia, anemia Lab Results      Component                Value               Date                      WBC                      6.8                 09/21/2023                HGB                      7.4 (L)             09/21/2023                HCT                      22.5 (L)            09/21/2023                MCV                      75.5 (L)            09/21/2023                PLT                      181                 09/21/2023              Anesthesia Other Findings   Reproductive/Obstetrics contained products  Lab Results      Component                Value               Date                      PREGTESTUR               POSITIVE (A)        09/20/2023  Anesthesia Physical Anesthesia Plan  ASA: 2 and emergent  Anesthesia Plan: General   Post-op Pain Management:    Induction: Intravenous, Rapid sequence and Cricoid pressure planned  PONV Risk Score and Plan: 3 and Ondansetron, Dexamethasone and  Midazolam  Airway Management Planned: Oral ETT  Additional Equipment: None  Intra-op Plan:   Post-operative Plan: Extubation in OR  Informed Consent: I have reviewed the patients History and Physical, chart, labs and discussed the procedure including the risks, benefits and alternatives for the proposed anesthesia with the patient or authorized representative who has indicated his/her understanding and acceptance.     Dental advisory given  Plan Discussed with: CRNA  Anesthesia Plan Comments:          Anesthesia Quick Evaluation

## 2023-09-21 NOTE — Anesthesia Procedure Notes (Incomplete)
 Procedure Name: Intubation Date/Time: 09/21/2023 6:52 PM  Performed by: Venia Carbon, CRNAPre-anesthesia Checklist: Patient identified, Emergency Drugs available and Suction available Patient Re-evaluated:Patient Re-evaluated prior to induction Oxygen Delivery Method: Circle system utilized Preoxygenation: Pre-oxygenation with 100% oxygen Induction Type: IV induction Laryngoscope Size: Mac and 3 Grade View: Grade I Tube type: Oral Tube size: 7.0 mm Number of attempts: 1 Airway Equipment and Method: Patient positioned with wedge pillow and Stylet Placement Confirmation: ETT inserted through vocal cords under direct vision, positive ETCO2, CO2 detector and breath sounds checked- equal and bilateral

## 2023-09-21 NOTE — Progress Notes (Signed)
 Checked in with patient, she is feeling better after surgery and transfusion of two pRBCs. Stable vitals noted, tolerating a little food. Discussed surgery details, all questions answered. Plan is to check hemoglobin in morning, discharge to home if remains stable.    Kayla Collins, MD, FACOG Obstetrician & Gynecologist, [facility] for [employer], [facility]

## 2023-09-22 ENCOUNTER — Encounter (HOSPITAL_COMMUNITY): Payer: Self-pay | Admitting: Obstetrics & Gynecology

## 2023-09-22 LAB — TYPE AND SCREEN
ABO/RH(D): O POS
Antibody Screen: NEGATIVE
Unit division: 0
Unit division: 0

## 2023-09-22 LAB — BPAM RBC
Blood Product Expiration Date: 202505072359
Blood Product Expiration Date: 202505072359
ISSUE DATE / TIME: 202504091859
ISSUE DATE / TIME: 202504091859
Unit Type and Rh: 5100
Unit Type and Rh: 5100

## 2023-09-22 LAB — CBC
HCT: 29.4 % — ABNORMAL LOW (ref 36.0–46.0)
Hemoglobin: 9.9 g/dL — ABNORMAL LOW (ref 12.0–15.0)
MCH: 26 pg (ref 26.0–34.0)
MCHC: 33.7 g/dL (ref 30.0–36.0)
MCV: 77.2 fL — ABNORMAL LOW (ref 80.0–100.0)
Platelets: 200 10*3/uL (ref 150–400)
RBC: 3.81 MIL/uL — ABNORMAL LOW (ref 3.87–5.11)
RDW: 18.4 % — ABNORMAL HIGH (ref 11.5–15.5)
WBC: 7 10*3/uL (ref 4.0–10.5)
nRBC: 0 % (ref 0.0–0.2)

## 2023-09-22 MED ORDER — ACETAMINOPHEN 500 MG PO TABS: 1000.0000 mg | ORAL_TABLET | Freq: Four times a day (QID) | ORAL | Status: AC | PRN

## 2023-09-22 MED ORDER — SENNOSIDES-DOCUSATE SODIUM 8.6-50 MG PO TABS
1.0000 | ORAL_TABLET | Freq: Two times a day (BID) | ORAL | 2 refills | Status: AC | PRN
Start: 2023-09-22 — End: ?

## 2023-09-22 MED ORDER — FERROUS SULFATE 324 MG PO TBEC: 324.0000 mg | DELAYED_RELEASE_TABLET | ORAL | 1 refills | Status: AC

## 2023-09-22 MED ORDER — IBUPROFEN 800 MG PO TABS: 800.0000 mg | ORAL_TABLET | Freq: Three times a day (TID) | ORAL | 2 refills | Status: AC | PRN

## 2023-09-22 NOTE — Discharge Summary (Signed)
 Gynecology Physician Postoperative Discharge Summary  Patient ID: Kayla Baird MRN: [id number] DOB/AGE: 1991/04/17 33 y.o.  Admit Date: 09/21/2023 Discharge Date: 09/22/2023  Preoperative Diagnoses:  Retained products of conception after misoprostol induced termination of pregnancy, acute blood loss anemia   Procedures: Procedure(s) (LRB): DILATION AND EVACUATION, UTERUS (N/A) under ultrasound guidance  Hospital Course:  Kayla Baird is a 33 y.o. [phone] with retained products of conception after misoprostol induced termination of pregnancy, acute blood loss anemia  She underwent the procedure as mentioned above, her operation was uncomplicated. For further details about surgery, please refer to the operative report. Given her symptomatic anemia, she required transfusion of two units of pRBCs which was uncomplicated. Patient had an uncomplicated postoperative course. By time of discharge on POD#1, her pain was controlled on oral pain medications; she was ambulating, voiding without difficulty, tolerating regular diet and passing flatus. Her post transfusion hemoglobin was 9.9, she had no symptoms of anemia, and was discharged on oral iron therapy.  She was deemed stable for discharge to home.   Significant Labs:    Latest Ref Rng & Units 09/22/2023    5:10 AM 09/21/2023    3:23 PM 09/20/2023    1:36 PM  CBC  WBC 4.0 - 10.5 K/uL 7.0  6.8  8.0   Hemoglobin 12.0 - 15.0 g/dL 9.9  7.4  9.1   Hematocrit 36.0 - 46.0 % 29.4  22.5  27.7   Platelets 150 - 400 K/uL 200  181  220     Discharge Exam: Blood pressure 98/65, pulse 76, temperature 97.8 F (36.6 C), temperature source Oral, resp. rate 16, height 5\' 7"  (1.702 m), weight 87.9 kg, last menstrual period 07/10/2023, SpO2 98%, unknown if currently breastfeeding. General appearance: alert and no distress  Resp: clear to auscultation bilaterally  Cardio: regular rate and rhythm  GI: soft, non-tender; bowel sounds normal; no masses, no  organomegaly.  Pelvic: deferred but scant blood on pad reported by patient Extremities: extremities normal, atraumatic, no cyanosis or edema and Homans sign is negative, no sign of DVT  Discharged Condition: Stable  Disposition: Discharge disposition: 01-Home or Self Care      Allergies as of 09/22/2023   No Known Allergies      Medication List     TAKE these medications    acetaminophen 500 MG tablet Commonly known as: TYLENOL Take 2 tablets (1,000 mg total) by mouth every 6 (six) hours as needed for mild pain (pain score 1-3) or headache.   ferrous sulfate 324 MG Tbec Take 1 tablet (324 mg total) by mouth every other day.   ibuprofen 800 MG tablet Commonly known as: ADVIL Take 1 tablet (800 mg total) by mouth 3 (three) times daily with meals as needed for moderate pain (pain score 4-6) or cramping.   ondansetron 4 MG tablet Commonly known as: Zofran Take 2 tablets (8 mg total) by mouth 2 (two) times daily.   oxyCODONE 5 MG immediate release tablet Commonly known as: Roxicodone Take 2 tablets (10 mg total) by mouth every 6 (six) hours as needed for up to 5 days for severe pain (pain score 7-10).   senna-docusate 8.6-50 MG tablet Commonly known as: Senokot-S Take 1 tablet by mouth 2 (two) times daily as needed for mild constipation or moderate constipation.       No future appointments.  Follow-up Information     [facility] for [facility] Follow up in 2 week(s).   Specialty:  Obstetrics and Gynecology Why: You will be contacted with appointment date and time for postoperative appointment that will be in 2-3 weeks Contact information: [address] [place] [address] [phone]                Total discharge time: 20 minutes   Signed:  Jaynie Collins, MD, FACOG Attending Obstetrician & Gynecologist Faculty Practice, [facility]

## 2023-09-23 LAB — SURGICAL PATHOLOGY

## 2023-10-13 ENCOUNTER — Encounter: Payer: Self-pay | Admitting: Obstetrics and Gynecology

## 2023-10-13 ENCOUNTER — Encounter: Admitting: Obstetrics and Gynecology

## 2024-01-10 ENCOUNTER — Ambulatory Visit (HOSPITAL_COMMUNITY): Admission: EM | Admit: 2024-01-10 | Discharge: 2024-01-10 | Disposition: A | Discharge status: Home / Self Care

## 2024-01-10 ENCOUNTER — Ambulatory Visit (INDEPENDENT_AMBULATORY_CARE_PROVIDER_SITE_OTHER)

## 2024-01-10 ENCOUNTER — Encounter (HOSPITAL_COMMUNITY): Payer: Self-pay | Admitting: Emergency Medicine

## 2024-01-10 DIAGNOSIS — S62634A Displaced fracture of distal phalanx of right ring finger, initial encounter for closed fracture: Secondary | ICD-10-CM

## 2024-01-10 DIAGNOSIS — M79644 Pain in right finger(s): Secondary | ICD-10-CM | POA: Diagnosis not present

## 2024-01-10 MED ORDER — IBUPROFEN 800 MG PO TABS
800.0000 mg | ORAL_TABLET | Freq: Once | ORAL | Status: AC
  Administered 2024-01-10: 800 mg via ORAL

## 2024-01-10 MED ORDER — IBUPROFEN 800 MG PO TABS
ORAL_TABLET | ORAL | Status: AC
  Filled 2024-01-10: qty 1

## 2024-01-10 NOTE — Discharge Instructions (Signed)
 We have given you information to follow-up with orthopedics.  We recommend reaching out to them and scheduling an appointment.  You can take Tylenol  and ibuprofen  as needed for pain relief.  You can use a finger splint or buddy tape for support of that finger.  Please return if you have significant worsening of symptoms, pain out of proportion, or if you have any other concerns.

## 2024-01-10 NOTE — ED Triage Notes (Signed)
 Pt states she got her right middle finger trap on her daughter shirt and he got her middle finger injured. Swollen and bruise noticed on the finger. Taking some Tylenol  at home with some pain relief.

## 2024-01-10 NOTE — ED Provider Notes (Signed)
 [facility]    CSN: [id number] Arrival date & time: 01/10/24  1606      History   Chief Complaint Chief Complaint  Patient presents with   Finger Injury    HPI Kayla Baird is a 33 y.o. female.   Patient is a 33 year old female who presents to the urgent care today with concerns of right fourth finger pain.  She reports that yesterday, she accidentally caught her finger on her daughter's shirt.  Since then, it has gotten bruised and swollen and painful.  She tried wearing a finger splint to help with her symptoms.  She denies any loss of sensation, pain out of proportion, pus drainage, or other concerns at this time.    Past Medical History:  Diagnosis Date   Chlamydia    Fibroids    UTI (urinary tract infection)    Yeast infection     Patient Active Problem List   Diagnosis Date Noted   Retained products of conception following termination of pregnancy 09/21/2023   Anemia due to acute blood loss 09/21/2023   Retained products of conception after miscarriage 09/21/2023    Past Surgical History:  Procedure Laterality Date   DILATION AND EVACUATION N/A 09/21/2023   Procedure: DILATION AND EVACUATION, UTERUS;  Surgeon: Herchel Gloris LABOR, MD;  Location: [facility] OR;  Service: Gynecology;  Laterality: N/A;  WITH ULTRASOUND   EYE SURGERY      OB History     Gravida  5   Para  2   Term  2   Preterm      AB  2   Living  2      SAB      IAB  2   Ectopic      Multiple      Live Births  2        Obstetric Comments  All AB's have been with medication          Home Medications    Prior to Admission medications   Medication Sig Start Date End Date Taking? Authorizing Provider  acetaminophen  (TYLENOL ) 500 MG tablet Take 2 tablets (1,000 mg total) by mouth every 6 (six) hours as needed for mild pain (pain score 1-3) or headache. 09/22/23   Anyanwu, Gloris LABOR, MD  ferrous sulfate  324 MG TBEC Take 1 tablet (324 mg total) by mouth every other  day. 09/22/23   Anyanwu, Ugonna A, MD  ibuprofen  (ADVIL ) 800 MG tablet Take 1 tablet (800 mg total) by mouth 3 (three) times daily with meals as needed for moderate pain (pain score 4-6) or cramping. 09/22/23   Anyanwu, Ugonna A, MD  ondansetron  (ZOFRAN ) 4 MG tablet Take 2 tablets (8 mg total) by mouth 2 (two) times daily. 09/20/23   Cooleen, Olam LABOR, NP  senna-docusate (SENOKOT-S) 8.6-50 MG tablet Take 1 tablet by mouth 2 (two) times daily as needed for mild constipation or moderate constipation. 09/22/23   Herchel Gloris LABOR, MD    Family History Family History  Problem Relation Age of Onset   Hypertension Mother    Healthy Father    Other Neg Hx     Social History Social History   Tobacco Use   Smoking status: Some Days    Current packs/day: 0.00    Types: Cigarettes    Last attempt to quit: 12/29/2011    Years since quitting: 12.0   Smokeless tobacco: Never  Vaping Use   Vaping status: Never Used  Substance Use Topics  Alcohol use: Yes    Comment: social alcohol, on the weekends   Drug use: Not Currently    Types: Marijuana    Comment: history     Allergies   Patient has no known allergies.   Review of Systems Review of Systems See HPI for relevant ROS.  Physical Exam Triage Vital Signs ED Triage Vitals  Encounter Vitals Group     BP 01/10/24 1621 119/83     Girls Systolic BP Percentile --      Girls Diastolic BP Percentile --      Boys Systolic BP Percentile --      Boys Diastolic BP Percentile --      Pulse Rate 01/10/24 1621 90     Resp 01/10/24 1621 18     Temp 01/10/24 1621 98.2 F (36.8 C)     Temp Source 01/10/24 1621 Oral     SpO2 01/10/24 1621 98 %     Weight --      Height --      Head Circumference --      Peak Flow --      Pain Score 01/10/24 1622 10     Pain Loc --      Pain Education --      Exclude from Growth Chart --    No data found.  Updated Vital Signs BP 119/83 (BP Location: Right Arm)   Pulse 90   Temp 98.2 F (36.8 C) (Oral)    Resp 18   LMP 12/13/2023 (Exact Date)   SpO2 98%   Breastfeeding Unknown   Visual Acuity Right Eye Distance:   Left Eye Distance:   Bilateral Distance:    Right Eye Near:   Left Eye Near:    Bilateral Near:     Physical Exam General: Alert and oriented, well-developed/well-nourished, calm, cooperative, no acute distress HEENT: Normocephalic atraumatic, moist mucous membranes, no scleral icterus, trachea midline Lungs: Speaking full sentences, non-labored respirations, no distress Heart: Regular rate and rhythm Abdomen:  Soft, nondistended Musculoskeletal: Edema and bruising of the distal right fourth finger with some decreased range of motion due to pain and swelling, full range of motion of all other fingers of the right hand Pulses: 2+ radial bilaterally, cap refill less than 2 seconds in all fingers Neurologic: Awake, A&O x4,, full sensation to light touch in the upper extremities Integumentary: Warm, dry, normal for ethnicity, intact, no rash Psychiatric: Appropriate mood & affect  UC Treatments / Results  Labs (all labs ordered are listed, but only abnormal results are displayed) Labs Reviewed - No data to display  EKG   Radiology DG Finger Ring Right Result Date: 01/10/2024 CLINICAL DATA:  Right fourth finger injury. EXAM: RIGHT RING FINGER 2+V COMPARISON:  None Available. FINDINGS: Moderately displaced fracture is seen involving the proximal base of the fourth distal phalanx. No other fracture or dislocation is noted. IMPRESSION: Moderately displaced fracture involving proximal base of fourth distal phalanx with intra-articular extension. Electronically Signed   By: Lynwood Landy Raddle M.D.   On: 01/10/2024 17:33    Procedures Procedures (including critical care time)  Medications Ordered in UC Medications  ibuprofen  (ADVIL ) tablet 800 mg (800 mg Oral Given 01/10/24 1752)    Initial Impression / Assessment and Plan / UC Course  I have reviewed the triage vital  signs and the nursing notes.  Pertinent labs & imaging results that were available during my care of the patient were reviewed by me and considered in my medical  decision making (see chart for details).    Presents with right fourth finger pain.  Differential diagnosis includes: Sprain, fracture, dislocation, contusion, hematoma, septic arthritis, gout, paronychia, felon, including other diagnoses.  History obtained from: Patient.  Plan at this time/rationale: X-ray of the right finger.  All ordered tests including imaging and labs were independently reviewed and interpreted by myself. Notable findings: Fracture of the fourth distal phalanx of the right hand.  Plan: Patient presents with pain, swelling, and bruising of the distal right fourth finger.  X-ray was obtained and radiologist reports moderately displaced fracture involving proximal base of fourth distal phalanx with intra-articular extension. Discussed RICE measures (rest, ice, compression, and elevation).  Buddy taped patient's fingers. Recommended ibuprofen  and Tylenol  for pain relief as needed.  Provided patient with information to follow-up with orthopedics for further evaluation and management.  Patient was neurovascularly intact at time of discharge.  Discussed return precautions with the patient.  Disposition: Stable to discharge home.   All questions answered to the best of this examiner's ability. Advised to f/u with PCP for further eval and/or reassessment. Patient agrees to plan.  An appropriate evaluation has been performed, and in my medical judgment there is currently no evidence of an immediate life-threatening or surgical condition. Discharge is therefore indicated at this time.  This document was created using the aid of voice recognition Scientist, clinical (histocompatibility and immunogenetics).  Final Clinical Impressions(s) / UC Diagnoses   Final diagnoses:  Pain of finger of right hand  Closed displaced fracture of distal phalanx of right  ring finger, initial encounter     Discharge Instructions      We have given you information to follow-up with orthopedics.  We recommend reaching out to them and scheduling an appointment.  You can take Tylenol  and ibuprofen  as needed for pain relief.  You can use a finger splint or buddy tape for support of that finger.  Please return if you have significant worsening of symptoms, pain out of proportion, or if you have any other concerns.     ED Prescriptions   None    PDMP not reviewed this encounter.   Melonie Locus, PA-C 01/10/24 1815
